# Patient Record
Sex: Male | Born: 1950 | Race: Black or African American | Hispanic: No | Marital: Married | Smoking: Former smoker
Health system: Southern US, Community
[De-identification: ages and names within clinical notes are randomized; demographics above are authoritative.]

## PROBLEM LIST (undated history)

## (undated) DIAGNOSIS — I10 Essential (primary) hypertension: Secondary | ICD-10-CM

## (undated) DIAGNOSIS — M1990 Unspecified osteoarthritis, unspecified site: Secondary | ICD-10-CM

## (undated) HISTORY — PX: NECK SURGERY: SHX720

---

## 2009-09-16 ENCOUNTER — Emergency Department (HOSPITAL_COMMUNITY): Admission: EM | Admit: 2009-09-16 | Discharge: 2009-09-16 | Payer: Self-pay | Admitting: Emergency Medicine

## 2011-02-25 ENCOUNTER — Ambulatory Visit (HOSPITAL_COMMUNITY): Payer: Self-pay | Admitting: Physical Therapy

## 2019-06-25 ENCOUNTER — Other Ambulatory Visit: Payer: Self-pay

## 2019-06-25 ENCOUNTER — Emergency Department (HOSPITAL_COMMUNITY)
Admission: EM | Admit: 2019-06-25 | Discharge: 2019-06-26 | Disposition: A | Discharge status: Home / Self Care | Payer: No Typology Code available for payment source | Attending: Emergency Medicine | Admitting: Emergency Medicine

## 2019-06-25 DIAGNOSIS — R531 Weakness: Secondary | ICD-10-CM | POA: Diagnosis not present

## 2019-06-25 DIAGNOSIS — J1282 Pneumonia due to coronavirus disease 2019: Secondary | ICD-10-CM | POA: Diagnosis not present

## 2019-06-25 DIAGNOSIS — R0602 Shortness of breath: Secondary | ICD-10-CM | POA: Diagnosis present

## 2019-06-25 DIAGNOSIS — R05 Cough: Secondary | ICD-10-CM | POA: Insufficient documentation

## 2019-06-25 DIAGNOSIS — U071 COVID-19: Secondary | ICD-10-CM | POA: Diagnosis not present

## 2019-06-25 DIAGNOSIS — R110 Nausea: Secondary | ICD-10-CM | POA: Diagnosis not present

## 2019-06-25 NOTE — ED Triage Notes (Signed)
Pt c/o sob, he was diagnosed with covid x 2 days ago.

## 2019-06-26 ENCOUNTER — Emergency Department (HOSPITAL_COMMUNITY): Payer: No Typology Code available for payment source

## 2019-06-26 ENCOUNTER — Encounter (HOSPITAL_COMMUNITY): Payer: Self-pay | Admitting: Emergency Medicine

## 2019-06-26 ENCOUNTER — Other Ambulatory Visit: Payer: Self-pay

## 2019-06-26 DIAGNOSIS — U071 COVID-19: Secondary | ICD-10-CM | POA: Diagnosis not present

## 2019-06-26 LAB — COMPREHENSIVE METABOLIC PANEL
ALT: 74 U/L — ABNORMAL HIGH (ref 0–44)
AST: 125 U/L — ABNORMAL HIGH (ref 15–41)
Albumin: 3.6 g/dL (ref 3.5–5.0)
Alkaline Phosphatase: 67 U/L (ref 38–126)
Anion gap: 11 (ref 5–15)
BUN: 36 mg/dL — ABNORMAL HIGH (ref 8–23)
CO2: 24 mmol/L (ref 22–32)
Calcium: 8.7 mg/dL — ABNORMAL LOW (ref 8.9–10.3)
Chloride: 100 mmol/L (ref 98–111)
Creatinine, Ser: 1.25 mg/dL — ABNORMAL HIGH (ref 0.61–1.24)
GFR calc Af Amer: >60 mL/min (ref >60)
GFR calc non Af Amer: 58 mL/min — ABNORMAL LOW (ref >60)
Glucose, Bld: 122 mg/dL — ABNORMAL HIGH (ref 70–99)
Potassium: 4.5 mmol/L (ref 3.5–5.1)
Sodium: 135 mmol/L (ref 135–145)
Total Bilirubin: 1.2 mg/dL (ref 0.3–1.2)
Total Protein: 7.9 g/dL (ref 6.5–8.1)

## 2019-06-26 LAB — CBC WITH DIFFERENTIAL/PLATELET
Abs Immature Granulocytes: 0.04 10*3/uL (ref 0.00–0.07)
Basophils Absolute: 0 10*3/uL (ref 0.0–0.1)
Basophils Relative: 0 %
Eosinophils Absolute: 0 10*3/uL (ref 0.0–0.5)
Eosinophils Relative: 0 %
HCT: 42.6 % (ref 39.0–52.0)
Hemoglobin: 13.5 g/dL (ref 13.0–17.0)
Immature Granulocytes: 1 %
Lymphocytes Relative: 13 %
Lymphs Abs: 1 10*3/uL (ref 0.7–4.0)
MCH: 31.7 pg (ref 26.0–34.0)
MCHC: 31.7 g/dL (ref 30.0–36.0)
MCV: 100 fL (ref 80.0–100.0)
Monocytes Absolute: 0.6 10*3/uL (ref 0.1–1.0)
Monocytes Relative: 8 %
Neutro Abs: 5.9 10*3/uL (ref 1.7–7.7)
Neutrophils Relative %: 78 %
Platelets: 155 10*3/uL (ref 150–400)
RBC: 4.26 MIL/uL (ref 4.22–5.81)
RDW: 12.2 % (ref 11.5–15.5)
WBC: 7.5 10*3/uL (ref 4.0–10.5)
nRBC: 0 % (ref 0.0–0.2)

## 2019-06-26 LAB — BRAIN NATRIURETIC PEPTIDE: B Natriuretic Peptide: 29 pg/mL (ref 0.0–100.0)

## 2019-06-26 LAB — TROPONIN I (HIGH SENSITIVITY)
Troponin I (High Sensitivity): 10 ng/L (ref <18)
Troponin I (High Sensitivity): 11 ng/L (ref <18)

## 2019-06-26 LAB — D-DIMER, QUANTITATIVE: D-Dimer, Quant: 1.24 ug/mL-FEU — ABNORMAL HIGH (ref 0.00–0.50)

## 2019-06-26 LAB — LIPASE, BLOOD: Lipase: 39 U/L (ref 11–51)

## 2019-06-26 MED ORDER — IOHEXOL 350 MG/ML SOLN
100.0000 mL | Freq: Once | INTRAVENOUS | Status: AC | PRN
Start: 2019-06-26 — End: 2019-06-26
  Administered 2019-06-26: 100 mL via INTRAVENOUS

## 2019-06-26 MED ORDER — FUROSEMIDE 10 MG/ML IJ SOLN
20.0000 mg | Freq: Once | INTRAMUSCULAR | Status: AC
Start: 2019-06-26 — End: 2019-06-26
  Administered 2019-06-26: 20 mg via INTRAVENOUS
  Filled 2019-06-26: qty 2

## 2019-06-26 MED ORDER — ALBUTEROL SULFATE HFA 108 (90 BASE) MCG/ACT IN AERS
4.0000 | INHALATION_SPRAY | Freq: Once | RESPIRATORY_TRACT | Status: AC
  Administered 2019-06-26: 4 via RESPIRATORY_TRACT
  Filled 2019-06-26: qty 6.7

## 2019-06-26 NOTE — Discharge Instructions (Signed)
Keep yourself quarantined as you have been doing.  Use Motrin or Tylenol as needed for aches and fevers.  Keep yourself hydrated.  Follow-up with your doctor.  Return to the ED with difficulty breathing, chest pain, any other concerns.

## 2019-06-26 NOTE — ED Provider Notes (Signed)
[facility] EMERGENCY DEPARTMENT Provider Note   CSN: [id number] Arrival date & time: 06/25/19  2334     History Chief Complaint  Patient presents with  . Shortness of Breath    Sergio Miller is a 69 y.o. male.  Patient reports a 1 week history of feeling poorly with body aches, nausea, chills, loss of smell, loss of taste, cough and shortness of breath.  He was seen at the [place] and found to be Covid positive on March 1.  He comes in tonight with generalized weakness, feeling poorly, nausea and shortness of breath with cough.  He states the cough is productive of clear mucus.  He has had nausea and no appetite but no vomiting.  Does not think that he has had a fever.  There is no chest pain.  There is no abdominal pain.  There is no focal weakness, numbness or tingling.  Patient denies any heart or lung problems.  Does not smoke.  No diagnosis of COPD or asthma.  No leg pain or leg swelling.  Has been using vitamin C and zinc at home without relief.  The history is provided by the patient.  Shortness of Breath Associated symptoms: cough   Associated symptoms: no abdominal pain, no chest pain, no fever, no headaches, no rash and no vomiting        History reviewed. No pertinent past medical history.  There are no problems to display for this patient.   Past Surgical History:  Procedure Laterality Date  . NECK SURGERY         No family history on file.  Social History   Tobacco Use  . Smoking status: Former Games developer  . Smokeless tobacco: Never Used  Substance Use Topics  . Alcohol use: Not Currently  . Drug use: Not Currently    Home Medications Prior to Admission medications   Not on File    Allergies    Patient has no allergy information on record.  Review of Systems   Review of Systems  Constitutional: Positive for activity change, appetite change, chills and fatigue. Negative for fever.  HENT: Positive for congestion and rhinorrhea.   Eyes: Negative for  visual disturbance.  Respiratory: Positive for cough and shortness of breath. Negative for chest tightness.   Cardiovascular: Negative for chest pain.  Gastrointestinal: Positive for nausea. Negative for abdominal pain and vomiting.  Genitourinary: Negative for dysuria and hematuria.  Musculoskeletal: Positive for arthralgias and myalgias.  Skin: Negative for rash.  Neurological: Positive for weakness. Negative for dizziness, light-headedness and headaches.   all other systems are negative except as noted in the HPI and PMH.    Physical Exam Updated Vital Signs BP 139/80   Pulse 74   Temp 99.7 F (37.6 C) (Oral)   Resp 13   Ht 5\' 11"  (1.803 m)   Wt 81.6 kg   SpO2 100%   BMI 25.10 kg/m   Physical Exam Vitals and nursing note reviewed.  Constitutional:      General: He is not in acute distress.    Appearance: Normal appearance. He is well-developed and normal weight. He is not ill-appearing.     Comments: No distress, speaking in full sentences  HENT:     Head: Normocephalic and atraumatic.     Mouth/Throat:     Pharynx: No oropharyngeal exudate.  Eyes:     Conjunctiva/sclera: Conjunctivae normal.     Pupils: Pupils are equal, round, and reactive to light.  Neck:  Comments: No meningismus. Cardiovascular:     Rate and Rhythm: Normal rate and regular rhythm.     Heart sounds: Normal heart sounds. No murmur.  Pulmonary:     Effort: Pulmonary effort is normal. No respiratory distress.     Breath sounds: Normal breath sounds. No wheezing.     Comments: Diminished breath sounds with good air exchange Chest:     Chest wall: No tenderness.  Abdominal:     Palpations: Abdomen is soft.     Tenderness: There is no abdominal tenderness. There is no guarding or rebound.  Musculoskeletal:        General: No tenderness. Normal range of motion.     Cervical back: Normal range of motion and neck supple.  Skin:    General: Skin is warm.     Capillary Refill: Capillary refill  takes less than 2 seconds.  Neurological:     General: No focal deficit present.     Mental Status: He is alert and oriented to person, place, and time. Mental status is at baseline.     Cranial Nerves: No cranial nerve deficit.     Motor: No abnormal muscle tone.     Coordination: Coordination normal.     Comments: No ataxia on finger to nose bilaterally. No pronator drift. 5/5 strength throughout. CN 2-12 intact.Equal grip strength. Sensation intact.   Psychiatric:        Behavior: Behavior normal.     ED Results / Procedures / Treatments   Labs (all labs ordered are listed, but only abnormal results are displayed) Labs Reviewed  COMPREHENSIVE METABOLIC PANEL - Abnormal; Notable for the following components:      Result Value   Glucose, Bld 122 (*)    BUN 36 (*)    Creatinine, Ser 1.25 (*)    Calcium 8.7 (*)    AST 125 (*)    ALT 74 (*)    GFR calc non Af Amer 58 (*)    All other components within normal limits  D-DIMER, QUANTITATIVE (NOT AT [facility]) - Abnormal; Notable for the following components:   D-Dimer, Quant 1.24 (*)    All other components within normal limits  CBC WITH DIFFERENTIAL/PLATELET  BRAIN NATRIURETIC PEPTIDE  LIPASE, BLOOD  URINALYSIS, ROUTINE W REFLEX MICROSCOPIC  TROPONIN I (HIGH SENSITIVITY)  TROPONIN I (HIGH SENSITIVITY)    EKG EKG Interpretation  Date/Time:  Wednesday June 26 2019 00:03:24 EST Ventricular Rate:  72 PR Interval:    QRS Duration: 97 QT Interval:  378 QTC Calculation: 414 R Axis:   41 Text Interpretation: Sinus rhythm RSR' in V1 or V2, probably normal variant Minimal ST elevation, anterior leads No previous ECGs available Confirmed by Ezequiel Essex [phone]) on 06/26/2019 12:12:32 AM   Radiology DG Chest Portable 1 View  Result Date: 06/26/2019 CLINICAL DATA:  Shortness of breath and COVID EXAM: PORTABLE CHEST 1 VIEW COMPARISON:  None. FINDINGS: The heart size and mediastinal contours are upper limits of normal. There is  prominence of the central pulmonary vasculature. No acute osseous abnormality. IMPRESSION: Pulmonary vascular congestion. Electronically Signed   By: Prudencio Pair M.D.   On: 06/26/2019 01:32    Procedures Procedures (including critical care time)  Medications Ordered in ED Medications  albuterol (VENTOLIN HFA) 108 (90 Base) MCG/ACT inhaler 4 puff (has no administration in time range)    ED Course  I have reviewed the triage vital signs and the nursing notes.  Pertinent labs & imaging results that were available  during my care of the patient were reviewed by me and considered in my medical decision making (see chart for details).    MDM Rules/Calculators/A&P                      Patient known to be Covid positive here with worsening shortness of breath, nausea, fatigue, anorexia.  Denies chest pain.  Stable vitals with no hypoxia or increased work of breathing.  Chest x-ray shows mild vascular congestion.  Patient with no respiratory distress or increased work of breathing. Labs reassuring.  Negative troponin, BNP is normal.  Mild transaminitis.  D-dimer 1.24 which is not unexpected given his Covid positive status. Minimal LFT elevation nonspecific in setting of viral illness.  He has no abdominal pain or vomiting. Low suspicion for ACS or pulmon ary embolism. Able to ambulate without desaturation.  CT scan shows no evidence of pulmonary embolism.  Does show multifocal pneumonia consistent with known diagnosis of coronavirus.  Dose of IV Lasix given due to mild edema.  Patient able to ambulate without desaturation. Supportive care discussed.  Follow-up with PCP for recheck as well as recheck of liver enzymes.  Return precautions discussed.  JAMARCO ZALDIVAR was evaluated in Emergency Department on 06/26/2019 for the symptoms described in the history of present illness. He was evaluated in the context of the global COVID-19 pandemic, which necessitated consideration that the patient might  be at risk for infection with the SARS-CoV-2 virus that causes COVID-19. Institutional protocols and algorithms that pertain to the evaluation of patients at risk for COVID-19 are in a state of rapid change based on information released by regulatory bodies including the CDC and federal and state organizations. These policies and algorithms were followed during the patient's care in the ED.  Final Clinical Impression(s) / ED Diagnoses Final diagnoses:  Pneumonia due to COVID-19 virus    Rx / DC Orders ED Discharge Orders    None       Crickett Abbett, Jeannett Senior, MD 06/26/19 [phone]

## 2019-06-26 NOTE — ED Notes (Signed)
Ambulated pt in room 02 started at 96%,dropped down to 93% during walk,pt sat down after walk 02 started to come back up.

## 2019-06-29 ENCOUNTER — Other Ambulatory Visit: Payer: Self-pay

## 2019-06-29 ENCOUNTER — Encounter (HOSPITAL_COMMUNITY): Payer: Self-pay | Admitting: Emergency Medicine

## 2019-06-29 ENCOUNTER — Emergency Department (HOSPITAL_COMMUNITY): Payer: No Typology Code available for payment source

## 2019-06-29 ENCOUNTER — Inpatient Hospital Stay (HOSPITAL_COMMUNITY)
Admission: EM | Admit: 2019-06-29 | Discharge: 2019-07-03 | DRG: 177 | Disposition: A | Discharge status: Home / Self Care | Payer: No Typology Code available for payment source | Attending: Internal Medicine | Admitting: Internal Medicine

## 2019-06-29 DIAGNOSIS — Z23 Encounter for immunization: Secondary | ICD-10-CM

## 2019-06-29 DIAGNOSIS — I10 Essential (primary) hypertension: Secondary | ICD-10-CM | POA: Diagnosis present

## 2019-06-29 DIAGNOSIS — E559 Vitamin D deficiency, unspecified: Secondary | ICD-10-CM | POA: Diagnosis present

## 2019-06-29 DIAGNOSIS — J1282 Pneumonia due to coronavirus disease 2019: Secondary | ICD-10-CM | POA: Diagnosis present

## 2019-06-29 DIAGNOSIS — R748 Abnormal levels of other serum enzymes: Secondary | ICD-10-CM | POA: Diagnosis present

## 2019-06-29 DIAGNOSIS — R0902 Hypoxemia: Secondary | ICD-10-CM | POA: Diagnosis present

## 2019-06-29 DIAGNOSIS — U071 COVID-19: Principal | ICD-10-CM

## 2019-06-29 DIAGNOSIS — R609 Edema, unspecified: Secondary | ICD-10-CM | POA: Diagnosis present

## 2019-06-29 DIAGNOSIS — R7401 Elevation of levels of liver transaminase levels: Secondary | ICD-10-CM | POA: Diagnosis not present

## 2019-06-29 DIAGNOSIS — Z87891 Personal history of nicotine dependence: Secondary | ICD-10-CM | POA: Diagnosis not present

## 2019-06-29 DIAGNOSIS — R7402 Elevation of levels of lactic acid dehydrogenase (LDH): Secondary | ICD-10-CM | POA: Diagnosis present

## 2019-06-29 DIAGNOSIS — Z8249 Family history of ischemic heart disease and other diseases of the circulatory system: Secondary | ICD-10-CM

## 2019-06-29 LAB — CBC WITH DIFFERENTIAL/PLATELET
Abs Immature Granulocytes: 0.08 10*3/uL — ABNORMAL HIGH (ref 0.00–0.07)
Basophils Absolute: 0 10*3/uL (ref 0.0–0.1)
Basophils Relative: 0 %
Eosinophils Absolute: 0 10*3/uL (ref 0.0–0.5)
Eosinophils Relative: 0 %
HCT: 42.2 % (ref 39.0–52.0)
Hemoglobin: 13.2 g/dL (ref 13.0–17.0)
Immature Granulocytes: 1 %
Lymphocytes Relative: 13 %
Lymphs Abs: 1 10*3/uL (ref 0.7–4.0)
MCH: 31.5 pg (ref 26.0–34.0)
MCHC: 31.3 g/dL (ref 30.0–36.0)
MCV: 100.7 fL — ABNORMAL HIGH (ref 80.0–100.0)
Monocytes Absolute: 0.7 10*3/uL (ref 0.1–1.0)
Monocytes Relative: 9 %
Neutro Abs: 6 10*3/uL (ref 1.7–7.7)
Neutrophils Relative %: 77 %
Platelets: 257 10*3/uL (ref 150–400)
RBC: 4.19 MIL/uL — ABNORMAL LOW (ref 4.22–5.81)
RDW: 12.1 % (ref 11.5–15.5)
WBC: 7.8 10*3/uL (ref 4.0–10.5)
nRBC: 0 % (ref 0.0–0.2)

## 2019-06-29 LAB — LACTIC ACID, PLASMA
Lactic Acid, Venous: 2 mmol/L (ref 0.5–1.9)
Lactic Acid, Venous: 1.8 mmol/L (ref 0.5–1.9)

## 2019-06-29 LAB — COMPREHENSIVE METABOLIC PANEL
ALT: 99 U/L — ABNORMAL HIGH (ref 0–44)
AST: 77 U/L — ABNORMAL HIGH (ref 15–41)
Albumin: 3.2 g/dL — ABNORMAL LOW (ref 3.5–5.0)
Alkaline Phosphatase: 98 U/L (ref 38–126)
Anion gap: 11 (ref 5–15)
BUN: 32 mg/dL — ABNORMAL HIGH (ref 8–23)
CO2: 22 mmol/L (ref 22–32)
Calcium: 8.7 mg/dL — ABNORMAL LOW (ref 8.9–10.3)
Chloride: 105 mmol/L (ref 98–111)
Creatinine, Ser: 1.09 mg/dL (ref 0.61–1.24)
GFR calc Af Amer: >60 mL/min (ref >60)
GFR calc non Af Amer: >60 mL/min (ref >60)
Glucose, Bld: 151 mg/dL — ABNORMAL HIGH (ref 70–99)
Potassium: 3.9 mmol/L (ref 3.5–5.1)
Sodium: 138 mmol/L (ref 135–145)
Total Bilirubin: 1.4 mg/dL — ABNORMAL HIGH (ref 0.3–1.2)
Total Protein: 7.7 g/dL (ref 6.5–8.1)

## 2019-06-29 LAB — FERRITIN: Ferritin: 2950 ng/mL — ABNORMAL HIGH (ref 24–336)

## 2019-06-29 LAB — LACTATE DEHYDROGENASE: LDH: 404 U/L — ABNORMAL HIGH (ref 98–192)

## 2019-06-29 LAB — PROCALCITONIN: Procalcitonin: 0.12 ng/mL

## 2019-06-29 LAB — ABO/RH: ABO/RH(D): O POS

## 2019-06-29 LAB — HIV ANTIBODY (ROUTINE TESTING W REFLEX): HIV Screen 4th Generation wRfx: NONREACTIVE

## 2019-06-29 LAB — D-DIMER, QUANTITATIVE: D-Dimer, Quant: 4.59 ug/mL-FEU — ABNORMAL HIGH (ref 0.00–0.50)

## 2019-06-29 LAB — TRIGLYCERIDES: Triglycerides: 110 mg/dL (ref <150)

## 2019-06-29 LAB — C-REACTIVE PROTEIN: CRP: 12.4 mg/dL — ABNORMAL HIGH (ref <1.0)

## 2019-06-29 LAB — FIBRINOGEN: Fibrinogen: 637 mg/dL — ABNORMAL HIGH (ref 210–475)

## 2019-06-29 MED ORDER — DEXAMETHASONE SODIUM PHOSPHATE 10 MG/ML IJ SOLN
10.0000 mg | Freq: Once | INTRAMUSCULAR | Status: AC
  Administered 2019-06-29: 10 mg via INTRAVENOUS
  Filled 2019-06-29: qty 1

## 2019-06-29 MED ORDER — ASCORBIC ACID 500 MG PO TABS
500.0000 mg | ORAL_TABLET | Freq: Every day | ORAL | Status: DC
  Administered 2019-06-29 – 2019-07-03 (×5): 500 mg via ORAL
  Filled 2019-06-29 (×5): qty 1

## 2019-06-29 MED ORDER — SODIUM CHLORIDE 0.9 % IV SOLN: 100.0000 mg | Freq: Every day | INTRAVENOUS | Status: DC

## 2019-06-29 MED ORDER — ONDANSETRON HCL 4 MG/2ML IJ SOLN: 4.0000 mg | Freq: Four times a day (QID) | INTRAMUSCULAR | Status: DC | PRN

## 2019-06-29 MED ORDER — ONDANSETRON HCL 4 MG PO TABS: 4.0000 mg | ORAL_TABLET | Freq: Four times a day (QID) | ORAL | Status: DC | PRN

## 2019-06-29 MED ORDER — TRAZODONE HCL 50 MG PO TABS
50.0000 mg | ORAL_TABLET | Freq: Every day | ORAL | Status: DC
  Administered 2019-06-29 – 2019-06-30 (×2): 50 mg via ORAL
  Filled 2019-06-29 (×3): qty 1

## 2019-06-29 MED ORDER — ALBUTEROL SULFATE (2.5 MG/3ML) 0.083% IN NEBU: 3.0000 mL | INHALATION_SOLUTION | RESPIRATORY_TRACT | Status: DC | PRN

## 2019-06-29 MED ORDER — DEXAMETHASONE SODIUM PHOSPHATE 10 MG/ML IJ SOLN
6.0000 mg | INTRAMUSCULAR | Status: DC
  Administered 2019-06-30 – 2019-07-02 (×3): 6 mg via INTRAVENOUS
  Filled 2019-06-29 (×3): qty 1

## 2019-06-29 MED ORDER — SODIUM CHLORIDE 0.9 % IV BOLUS
500.0000 mL | Freq: Once | INTRAVENOUS | Status: AC
  Administered 2019-06-29: 500 mL via INTRAVENOUS

## 2019-06-29 MED ORDER — GUAIFENESIN-DM 100-10 MG/5ML PO SYRP: 10.0000 mL | ORAL_SOLUTION | ORAL | Status: DC | PRN

## 2019-06-29 MED ORDER — ZINC SULFATE 220 (50 ZN) MG PO CAPS
220.0000 mg | ORAL_CAPSULE | Freq: Every day | ORAL | Status: DC
  Administered 2019-06-29 – 2019-07-03 (×5): 220 mg via ORAL
  Filled 2019-06-29 (×5): qty 1

## 2019-06-29 MED ORDER — ENSURE ENLIVE PO LIQD
237.0000 mL | Freq: Two times a day (BID) | ORAL | Status: DC
  Administered 2019-06-30 – 2019-07-03 (×7): 237 mL via ORAL

## 2019-06-29 MED ORDER — ENOXAPARIN SODIUM 40 MG/0.4ML ~~LOC~~ SOLN
40.0000 mg | SUBCUTANEOUS | Status: DC
  Administered 2019-06-29 – 2019-07-02 (×4): 40 mg via SUBCUTANEOUS
  Filled 2019-06-29 (×4): qty 0.4

## 2019-06-29 MED ORDER — SODIUM CHLORIDE 0.9 % IV SOLN: 100.0000 mg | INTRAVENOUS | Status: AC

## 2019-06-29 NOTE — H&P (Signed)
History and Physical    Sergio Miller:[id number] DOB: 1950-09-23 DOA: 06/29/2019  PCP: Patient, No Pcp Per   Patient coming from: Home  I have personally briefly reviewed patient's old medical records in [place]  Chief Complaint: Cough, worsening difficulty breathing.  HPI: Sergio Miller is a 69 y.o. male with no significant past medical history, recently tested positive for Covid 19 infection at [facility] 5 days ago March 1st. Presented to the ED 3/3 with complaints of body aches, productive cough and difficulty breathing.  Chest x-ray showed mild vascular congestion, CTA chest shows multifocal pneumonia but no evidence of PE.  IV Lasix given for mild edema.  Patient was able to ambulate without desaturation hours and was discharged to follow-up closely with primary care provider.  Patient presented today with complaints of difficulty breathing, not able to eat, nausea and 2 episodes of diarrhea, not being able to sleep since he left the hospital.  He denies chest pain.  ED Course: O2 sats greater than 93% on room air, with ambulation O2 sat momentarily dropped to 89%, but mostly remained above 92%.  Temperature 98.4.  Mild elevation in liver enzymes AST 77, ALT 99.  Increasing D-dimer compared to 3 days ago.  Procalcitonin 0.12.  Also elevated LDH, fibrinogen.  In-house Covid 19 test pending.  Chest x-ray today consistent with Covid pneumonia.  Hospitalist to admit for further evaluation and management.  Review of Systems: As per HPI all other systems reviewed and negative.  History reviewed. No pertinent past medical history.  Past Surgical History:  Procedure Laterality Date  . NECK SURGERY       reports that he has quit smoking. He has never used smokeless tobacco. He reports previous alcohol use. He reports previous drug use.  No Known Allergies  Family hx of Hypertension.  Prior to Admission medications   Not on File    Physical Exam: Vitals:   06/29/19  1400 06/29/19 1415 06/29/19 1430 06/29/19 1500  BP:   (!) 146/85 (!) 141/84  Pulse: 65 (!) 58    Resp: 16 (!) 25 (!) 21 20  Temp:      TempSrc:      SpO2: 96% 95%    Weight:      Height:        Constitutional: NAD, calm, comfortable Vitals:   06/29/19 1400 06/29/19 1415 06/29/19 1430 06/29/19 1500  BP:   (!) 146/85 (!) 141/84  Pulse: 65 (!) 58    Resp: 16 (!) 25 (!) 21 20  Temp:      TempSrc:      SpO2: 96% 95%    Weight:      Height:       Eyes: PERRL, lids and conjunctivae normal ENMT: Mucous membranes are moist. Neck: normal, supple, no masses, no thyromegaly Respiratory:  Normal respiratory effort. No accessory muscle use.  Cardiovascular: Regular rate and rhythm, no murmurs / rubs / gallops. No extremity edema. 2+ pedal pulses. Abdomen: no tenderness, no masses palpated. No hepatosplenomegaly. Bowel sounds positive.  Musculoskeletal: no clubbing / cyanosis. No joint deformity upper and lower extremities. Good ROM, no contractures. Normal muscle tone.  Skin: no rashes, lesions, ulcers. No induration Neurologic: No apparent cranial nerve abnormality, moving all extremities spontaneously. Psychiatric: Normal judgment and insight. Alert and oriented x 3. Normal mood.   Labs on Admission: I have personally reviewed following labs and imaging studies  CBC: Recent Labs  Lab 06/26/19 0052 06/29/19 1319  WBC 7.5 7.8  NEUTROABS 5.9 6.0  HGB 13.5 13.2  HCT 42.6 42.2  MCV 100.0 100.7*  PLT 155 257   Basic Metabolic Panel: Recent Labs  Lab 06/26/19 0052 06/29/19 1319  NA 135 138  K 4.5 3.9  CL 100 105  CO2 24 22  GLUCOSE 122* 151*  BUN 36* 32*  CREATININE 1.25* 1.09  CALCIUM 8.7* 8.7*   Liver Function Tests: Recent Labs  Lab 06/26/19 0052 06/29/19 1319  AST 125* 77*  ALT 74* 99*  ALKPHOS 67 98  BILITOT 1.2 1.4*  PROT 7.9 7.7  ALBUMIN 3.6 3.2*   Recent Labs  Lab 06/26/19 0253  LIPASE 39   Lipid Profile: Recent Labs    06/29/19 1319  TRIG 110    Anemia Panel: Recent Labs    06/29/19 1340  FERRITIN 2,950*    Radiological Exams on Admission: DG Chest Port 1 View  Result Date: 06/29/2019 CLINICAL DATA:  Increased shortness of breath, decreased appetite, unable to sleep, nausea and diarrhea since discharge. EXAM: PORTABLE CHEST 1 VIEW COMPARISON:  Chest radiograph 06/26/2019 FINDINGS: Stable cardiomediastinal contours. There are bilateral coarse interstitial opacities which appear similar to prior. No new focal pulmonary opacity. No pneumothorax or significant pleural effusion. No acute finding in the visualized skeleton. IMPRESSION: No acute cardiopulmonary finding. Appearance of the chest is similar to prior radiograph when patient was diagnosed with COVID pneumonia. Of note the pneumonia was not well appreciated radiographically. Electronically Signed   By: Emmaline Kluver M.D.   On: 06/29/2019 13:41    EKG: Sinus rhythm rate 66.  QTc 438.  No significant ST-T wave abnormalities, compared to EKG 3 days ago.  Assessment/Plan Active Problems:   Pneumonia due to COVID-19 virus  COVID-19 pneumonia-dyspnea, cough, diarrhea.  O2 sats greater than 93% at rest, momentarily dropped to 88% during ambulation.  Tested + for [facility]-19 at [facility] March 1.  Increase in D-dimer 1.24 > 4.59.  Other inflammatory markers elevated- CRP, Ferritin, fibrinogen. CTA chest 3/3, negative for PE, consistent with Covid pneumonia.  Chest x-ray today with similar findings.  Procalcitonin 0.12.  WBC 7.8.  COVID-19 test pending. -IV dexamethasone 10 mg x 1, continue 6 mg daily -Remdesivir pharmacy to dose pending positive Covid result -Daily inflammatory markers -Daily CBC, CMP -Flutter valve, incentive spirometry -Multivitamins -Mucolytic's, PRN albuterol inhaler, as needed supplemental O2 - COVID 19 admission protocol.   Transaminitis- AST 77, ALT- 99. Likely from viral covid infection.  - Acute hepatitis panel check.  DVT prophylaxis: Lovenox Code Status:  Full code Family Communication: None at bedside Disposition Plan: > 2 days, requiring steroids and intravenous antiviral treatments Consults called: None Admission status: Inpatient telemetry. I certify that at the point of admission it is my clinical judgment that the patient will require inpatient hospital care spanning beyond 2 midnights from the point of admission due to high intensity of service, high risk for further deterioration and high frequency of surveillance required.    Onnie Boer MD [facility]  06/29/2019, 6:03 PM

## 2019-06-29 NOTE — ED Notes (Signed)
Pt ambulated back and forth around bed within room, on third time pt RA sats decreased to 88% for a couple of seconds, as soon as pt returned and sat on side of bed, RA sats back to 94-95 %.  RA sats mostly stayed 96-92% during ambulation

## 2019-06-29 NOTE — ED Notes (Signed)
Date and time results received: 06/29/19 1422 (use smartphrase ".now" to insert current time)  Test: Lactic Acid   Critical Value: 2.0  Name of Provider Notified: Dr. Rubin Payor  Orders Received? Or Actions Taken?: na

## 2019-06-29 NOTE — ED Provider Notes (Signed)
[place] Provider Note   CSN: [id number] Arrival date & time: 06/29/19  1236     History Chief Complaint  Patient presents with  . Shortness of Breath    Sergio Miller is a 69 y.o. male.  HPI Patient presents with known Covid infection.  Reportedly diagnosed foot through the [place] when he was seen on the ER 4 days ago.  States he had symptoms for around a week before that.  ER thought he had some volume overloaded and given some Lasix.  CT scan done and showed likely Covid pneumonia.  Patient states that he has been getting worse.  States he really cannot get up and move around home.  States he has no appetite.  Is had nausea vomiting diarrhea.  States he gets so short of breath he really cannot walk around.  States he is worried it is going to die at home alone.  Denies previous lung issues.    History reviewed. No pertinent past medical history.  There are no problems to display for this patient.   Past Surgical History:  Procedure Laterality Date  . NECK SURGERY         No family history on file.  Social History   Tobacco Use  . Smoking status: Former Research scientist (life sciences)  . Smokeless tobacco: Never Used  Substance Use Topics  . Alcohol use: Not Currently  . Drug use: Not Currently    Home Medications Prior to Admission medications   Not on File    Allergies    Patient has no known allergies.  Review of Systems   Review of Systems  Physical Exam Updated Vital Signs BP 123/84   Pulse 65   Temp 98.4 F (36.9 C) (Oral)   Resp 16   Ht 5\' 11"  (1.803 m)   Wt 86 kg   SpO2 96%   BMI 26.44 kg/m   Physical Exam  ED Results / Procedures / Treatments   Labs (all labs ordered are listed, but only abnormal results are displayed) Labs Reviewed  LACTIC ACID, PLASMA - Abnormal; Notable for the following components:      Result Value   Lactic Acid, Venous 2.0 (*)    All other components within normal limits  CBC WITH DIFFERENTIAL/PLATELET -  Abnormal; Notable for the following components:   RBC 4.19 (*)    MCV 100.7 (*)    Abs Immature Granulocytes 0.08 (*)    All other components within normal limits  COMPREHENSIVE METABOLIC PANEL - Abnormal; Notable for the following components:   Glucose, Bld 151 (*)    BUN 32 (*)    Calcium 8.7 (*)    Albumin 3.2 (*)    AST 77 (*)    ALT 99 (*)    Total Bilirubin 1.4 (*)    All other components within normal limits  D-DIMER, QUANTITATIVE (NOT AT [facility]) - Abnormal; Notable for the following components:   D-Dimer, Quant 4.59 (*)    All other components within normal limits  LACTATE DEHYDROGENASE - Abnormal; Notable for the following components:   LDH 404 (*)    All other components within normal limits  FIBRINOGEN - Abnormal; Notable for the following components:   Fibrinogen 637 (*)    All other components within normal limits  CULTURE, BLOOD (ROUTINE X 2)  CULTURE, BLOOD (ROUTINE X 2)  SARS CORONAVIRUS 2 (TAT 6-24 HRS)  PROCALCITONIN  TRIGLYCERIDES  LACTIC ACID, PLASMA  FERRITIN  C-REACTIVE PROTEIN    EKG  EKG Interpretation  Date/Time:  Saturday June 29 2019 13:15:04 EST Ventricular Rate:  66 PR Interval:    QRS Duration: 93 QT Interval:  418 QTC Calculation: 438 R Axis:   35 Text Interpretation: Sinus rhythm RSR' in V1 or V2, probably normal variant ST elevation, consider inferior injury Confirmed by Benjiman Core [phone]) on 06/29/2019 1:40:48 PM   Radiology DG Chest Port 1 View  Result Date: 06/29/2019 CLINICAL DATA:  Increased shortness of breath, decreased appetite, unable to sleep, nausea and diarrhea since discharge. EXAM: PORTABLE CHEST 1 VIEW COMPARISON:  Chest radiograph 06/26/2019 FINDINGS: Stable cardiomediastinal contours. There are bilateral coarse interstitial opacities which appear similar to prior. No new focal pulmonary opacity. No pneumothorax or significant pleural effusion. No acute finding in the visualized skeleton. IMPRESSION: No acute  cardiopulmonary finding. Appearance of the chest is similar to prior radiograph when patient was diagnosed with COVID pneumonia. Of note the pneumonia was not well appreciated radiographically. Electronically Signed   By: Emmaline Kluver M.D.   On: 06/29/2019 13:41    Procedures Procedures (including critical care time)  Medications Ordered in ED Medications  sodium chloride 0.9 % bolus 500 mL (has no administration in time range)    ED Course  I have reviewed the triage vital signs and the nursing notes.  Pertinent labs & imaging results that were available during my care of the patient were reviewed by me and considered in my medical decision making (see chart for details).    MDM Rules/Calculators/A&P                     Patient with Covid pneumonia.  CT scan done recently that showed pneumonia and had outpatient Covid test was positive.  Repeated test here.  Has not done well at home since being discharged.  States more short of breath nausea vomiting and really not able to eat and drink.  Patient is somewhat dyspneic and having difficulty speaking.  Oxygenation is okay at rest with sats it will dip from the low 90s up to the mid 90s.  However with ambulation his sats went down to the upper 80s.  Feels the patient would benefit from [facility] the hospital.  Will discuss with hospitalist Final Clinical Impression(s) / ED Diagnoses Final diagnoses:  Pneumonia due to COVID-19 virus  Hypoxia    Rx / DC Orders ED Discharge Orders    None       Benjiman Core, MD 06/29/19 1501

## 2019-06-29 NOTE — ED Triage Notes (Addendum)
Increased shortness of breath, decreased appetite, unable to sleep, nausea and diarrhea since discharge on wed, pt covid +

## 2019-06-30 ENCOUNTER — Encounter (HOSPITAL_COMMUNITY): Payer: Self-pay | Admitting: Internal Medicine

## 2019-06-30 DIAGNOSIS — R748 Abnormal levels of other serum enzymes: Secondary | ICD-10-CM | POA: Diagnosis present

## 2019-06-30 DIAGNOSIS — R7401 Elevation of levels of liver transaminase levels: Secondary | ICD-10-CM

## 2019-06-30 DIAGNOSIS — J1282 Pneumonia due to coronavirus disease 2019: Secondary | ICD-10-CM

## 2019-06-30 DIAGNOSIS — I10 Essential (primary) hypertension: Secondary | ICD-10-CM

## 2019-06-30 DIAGNOSIS — U071 COVID-19: Principal | ICD-10-CM

## 2019-06-30 LAB — CBC WITH DIFFERENTIAL/PLATELET
Abs Immature Granulocytes: 0.12 10*3/uL — ABNORMAL HIGH (ref 0.00–0.07)
Basophils Absolute: 0 10*3/uL (ref 0.0–0.1)
Basophils Relative: 0 %
Eosinophils Absolute: 0 10*3/uL (ref 0.0–0.5)
Eosinophils Relative: 0 %
HCT: 38.9 % — ABNORMAL LOW (ref 39.0–52.0)
Hemoglobin: 12.3 g/dL — ABNORMAL LOW (ref 13.0–17.0)
Immature Granulocytes: 2 %
Lymphocytes Relative: 13 %
Lymphs Abs: 0.9 10*3/uL (ref 0.7–4.0)
MCH: 31.4 pg (ref 26.0–34.0)
MCHC: 31.6 g/dL (ref 30.0–36.0)
MCV: 99.2 fL (ref 80.0–100.0)
Monocytes Absolute: 0.6 10*3/uL (ref 0.1–1.0)
Monocytes Relative: 9 %
Neutro Abs: 4.9 10*3/uL (ref 1.7–7.7)
Neutrophils Relative %: 76 %
Platelets: 271 10*3/uL (ref 150–400)
RBC: 3.92 MIL/uL — ABNORMAL LOW (ref 4.22–5.81)
RDW: 12.3 % (ref 11.5–15.5)
WBC: 6.5 10*3/uL (ref 4.0–10.5)
nRBC: 0 % (ref 0.0–0.2)

## 2019-06-30 LAB — HEMOGLOBIN A1C
Hgb A1c MFr Bld: 6.2 % — ABNORMAL HIGH (ref 4.8–5.6)
Mean Plasma Glucose: 131.24 mg/dL

## 2019-06-30 LAB — COMPREHENSIVE METABOLIC PANEL
ALT: 147 U/L — ABNORMAL HIGH (ref 0–44)
AST: 115 U/L — ABNORMAL HIGH (ref 15–41)
Albumin: 2.9 g/dL — ABNORMAL LOW (ref 3.5–5.0)
Alkaline Phosphatase: 111 U/L (ref 38–126)
Anion gap: 9 (ref 5–15)
BUN: 29 mg/dL — ABNORMAL HIGH (ref 8–23)
CO2: 23 mmol/L (ref 22–32)
Calcium: 8.9 mg/dL (ref 8.9–10.3)
Chloride: 109 mmol/L (ref 98–111)
Creatinine, Ser: 0.95 mg/dL (ref 0.61–1.24)
GFR calc Af Amer: >60 mL/min (ref >60)
GFR calc non Af Amer: >60 mL/min (ref >60)
Glucose, Bld: 153 mg/dL — ABNORMAL HIGH (ref 70–99)
Potassium: 4.2 mmol/L (ref 3.5–5.1)
Sodium: 141 mmol/L (ref 135–145)
Total Bilirubin: 1.1 mg/dL (ref 0.3–1.2)
Total Protein: 6.9 g/dL (ref 6.5–8.1)

## 2019-06-30 LAB — D-DIMER, QUANTITATIVE: D-Dimer, Quant: 3.74 ug/mL-FEU — ABNORMAL HIGH (ref 0.00–0.50)

## 2019-06-30 LAB — PHOSPHORUS: Phosphorus: 3.4 mg/dL (ref 2.5–4.6)

## 2019-06-30 LAB — HEPATITIS PANEL, ACUTE
HCV Ab: NONREACTIVE
Hep A IgM: NONREACTIVE
Hep B C IgM: NONREACTIVE
Hepatitis B Surface Ag: NONREACTIVE

## 2019-06-30 LAB — VITAMIN D 25 HYDROXY (VIT D DEFICIENCY, FRACTURES): Vit D, 25-Hydroxy: 18.13 ng/mL — ABNORMAL LOW (ref 30–100)

## 2019-06-30 LAB — MAGNESIUM: Magnesium: 2.5 mg/dL — ABNORMAL HIGH (ref 1.7–2.4)

## 2019-06-30 LAB — FERRITIN: Ferritin: 2591 ng/mL — ABNORMAL HIGH (ref 24–336)

## 2019-06-30 LAB — SARS CORONAVIRUS 2 (TAT 6-24 HRS): SARS Coronavirus 2: POSITIVE — AB

## 2019-06-30 LAB — C-REACTIVE PROTEIN: CRP: 10.1 mg/dL — ABNORMAL HIGH (ref <1.0)

## 2019-06-30 MED ORDER — SODIUM CHLORIDE 0.9 % IV SOLN
100.0000 mg | Freq: Every day | INTRAVENOUS | Status: DC
  Administered 2019-07-01 – 2019-07-03 (×3): 100 mg via INTRAVENOUS
  Filled 2019-06-30 (×3): qty 20

## 2019-06-30 MED ORDER — AMLODIPINE BESYLATE 5 MG PO TABS
5.0000 mg | ORAL_TABLET | Freq: Every day | ORAL | Status: DC
  Administered 2019-06-30 – 2019-07-03 (×4): 5 mg via ORAL
  Filled 2019-06-30 (×4): qty 1

## 2019-06-30 MED ORDER — SODIUM CHLORIDE 0.9 % IV SOLN
100.0000 mg | INTRAVENOUS | Status: AC
  Administered 2019-06-30 (×2): 100 mg via INTRAVENOUS
  Filled 2019-06-30 (×2): qty 20

## 2019-06-30 NOTE — Progress Notes (Signed)
LAB called with critical--COVID positive

## 2019-06-30 NOTE — Progress Notes (Addendum)
PROGRESS NOTE [facility]   Sergio Miller  KYH:[id number]  DOB: 1950/06/04  DOA: 06/29/2019 PCP: Patient, No Pcp Per   Brief Admission Hx: 69 year old gentleman recently tested positive for COVID-19 infection in [place] 5 days prior to arrival presented with progressive shortness of breath cough and body aches.  He was admitted with Covid pneumonia.  MDM/Assessment & Plan:   1. Covid pneumonia-patient presented with symptomatic shortness of breath, positive Covid test and abnormal chest x-ray and elevated inflammatory markers.  The patient has been started on dexamethasone and remdesivir per pharmacy.  We are following inflammatory markers daily providing supportive care and Covid isolation.  Check Vitamin D level.  2. Elevated liver enzymes-hepatitis panel pending continue to follow closely possibly related to acute Covid 19 illness.  If LFTs continue rising would discontinue remdesivir. 3. Essential hypertension-patient has had elevated blood pressure readings not currently on home medications or treatments for this will follow and if persist initiate treatments.   DVT prophylaxis: lovenox Code Status: Full Family Communication: patient updated bedside, verbalizes understanding, has full capacity Disposition Plan: Patient from home requiring IV remdesivir treatments and steroids and supportive care for COVID-19 infection when he is medically stabilized should be able to discharge home  Consultants:    Procedures:    Antimicrobials:     Subjective: Pt reports that he still has SOB especially when ambulating to bathroom.   Objective: Vitals:   06/29/19 1702 06/29/19 2120 06/30/19 0459 06/30/19 0811  BP: (!) 143/84 122/78 (!) 138/96 (!) 139/95  Pulse: (!) 53 61 63 (!) 56  Resp: 18 17 18 18   Temp: 98.7 F (37.1 C) 97.7 F (36.5 C) 98.3 F (36.8 C) 97.6 F (36.4 C)  TempSrc: Oral Oral Oral Oral  SpO2: 95% 99% 98% 95%  Weight:      Height:         Intake/Output Summary (Last 24 hours) at 06/30/2019 1144 Last data filed at 06/30/2019 0600 Gross per 24 hour  Intake 657.31 ml  Output --  Net 657.31 ml   Filed Weights   06/29/19 1249  Weight: 86 kg     REVIEW OF SYSTEMS  As per history otherwise all reviewed and reported negative  Exam:  General exam: elderly gentleman awake, alert, NAD, cooperative.  Respiratory system:  No increased work of breathing. Cardiovascular system: S1 & S2 heard. No JVD, murmurs, gallops, clicks or pedal edema. Gastrointestinal system: Abdomen is nondistended, soft and nontender. Normal bowel sounds heard. Central nervous system: Alert and oriented. No focal neurological deficits. Extremities: no CCE.  Data Reviewed: Basic Metabolic Panel: Recent Labs  Lab 06/26/19 0052 06/29/19 1319 06/30/19 0654  NA 135 138 141  K 4.5 3.9 4.2  CL 100 105 109  CO2 24 22 23   GLUCOSE 122* 151* 153*  BUN 36* 32* 29*  CREATININE 1.25* 1.09 0.95  CALCIUM 8.7* 8.7* 8.9  MG  --   --  2.5*  PHOS  --   --  3.4   Liver Function Tests: Recent Labs  Lab 06/26/19 0052 06/29/19 1319 06/30/19 0654  AST 125* 77* 115*  ALT 74* 99* 147*  ALKPHOS 67 98 111  BILITOT 1.2 1.4* 1.1  PROT 7.9 7.7 6.9  ALBUMIN 3.6 3.2* 2.9*   Recent Labs  Lab 06/26/19 0253  LIPASE 39   No results for input(s): AMMONIA in the last 168 hours. CBC: Recent Labs  Lab 06/26/19 0052 06/29/19 1319 06/30/19 0654  WBC 7.5 7.8 6.5  NEUTROABS 5.9 6.0 4.9  HGB 13.5 13.2 12.3*  HCT 42.6 42.2 38.9*  MCV 100.0 100.7* 99.2  PLT 155 257 271   Cardiac Enzymes: No results for input(s): CKTOTAL, CKMB, CKMBINDEX, TROPONINI in the last 168 hours. CBG (last 3)  No results for input(s): GLUCAP in the last 72 hours. Recent Results (from the past 240 hour(s))  SARS CORONAVIRUS 2 (TAT 6-24 HRS) Nasopharyngeal Nasopharyngeal Swab     Status: Abnormal   Collection Time: 06/29/19  1:00 PM   Specimen: Nasopharyngeal Swab  Result Value Ref  Range Status   SARS Coronavirus 2 POSITIVE (A) NEGATIVE Final    Comment: RESULT CALLED TO, READ BACK BY AND VERIFIED WITH: RN RENEE BONDURANT AT 6433 BY MESSAN HOUEGNIFIO ON 06/30/2019 (NOTE) SARS-CoV-2 target nucleic acids are DETECTED. The SARS-CoV-2 RNA is generally detectable in upper and lower respiratory specimens during the acute phase of infection. Positive results are indicative of the presence of SARS-CoV-2 RNA. Clinical correlation with patient history and other diagnostic information is  necessary to determine patient infection status. Positive results do not rule out bacterial infection or co-infection with other viruses.  The expected result is Negative. Fact Sheet for Patients: SugarRoll.be Fact Sheet for Healthcare Providers: https://www.woods-mathews.com/ This test is not yet approved or cleared by the [place] FDA and  has been authorized for detection and/or diagnosis of SARS-CoV-2 by FDA under an Emergency Use Authorization (EUA). This EUA will remain  in effect (meaning this t est can be used) for the duration of the COVID-19 declaration under Section 564(b)(1) of the Act, 21 U.S.C. section 360bbb-3(b)(1), unless the authorization is terminated or revoked sooner. Performed at [facility], [place] [address] [place], [place] [address]   Blood Culture (routine x 2)     Status: None (Preliminary result)   Collection Time: 06/29/19  1:19 PM   Specimen: Right Antecubital; Blood  Result Value Ref Range Status   Specimen Description   Final    RIGHT ANTECUBITAL BOTTLES DRAWN AEROBIC AND ANAEROBIC   Special Requests   Final    Blood Culture adequate volume Performed at [facility], [address] [place], [place] [address]    Culture PENDING  Incomplete   Report Status PENDING  Incomplete  Blood Culture (routine x 2)     Status: None (Preliminary result)   Collection Time: 06/29/19  1:40 PM   Specimen: Left  Antecubital; Blood  Result Value Ref Range Status   Specimen Description   Final    LEFT ANTECUBITAL BOTTLES DRAWN AEROBIC AND ANAEROBIC   Special Requests   Final    Blood Culture adequate volume Performed at [facility], [address] [place], [place] [address]    Culture PENDING  Incomplete   Report Status PENDING  Incomplete     Studies: DG Chest Port 1 View  Result Date: 06/29/2019 CLINICAL DATA:  Increased shortness of breath, decreased appetite, unable to sleep, nausea and diarrhea since discharge. EXAM: PORTABLE CHEST 1 VIEW COMPARISON:  Chest radiograph 06/26/2019 FINDINGS: Stable cardiomediastinal contours. There are bilateral coarse interstitial opacities which appear similar to prior. No new focal pulmonary opacity. No pneumothorax or significant pleural effusion. No acute finding in the visualized skeleton. IMPRESSION: No acute cardiopulmonary finding. Appearance of the chest is similar to prior radiograph when patient was diagnosed with COVID pneumonia. Of note the pneumonia was not well appreciated radiographically. Electronically Signed   By: Audie Pinto M.D.   On: 06/29/2019 13:41  Scheduled Meds: . vitamin C  500 mg Oral Daily  . dexamethasone (DECADRON) injection  6 mg Intravenous Q24H  . enoxaparin (LOVENOX) injection  40 mg Subcutaneous Q24H  . feeding supplement (ENSURE ENLIVE)  237 mL Oral BID BM  . traZODone  50 mg Oral QHS  . zinc sulfate  220 mg Oral Daily   Continuous Infusions: . [START ON 07/01/2019] remdesivir 100 mg in NS 100 mL      Active Problems:   Pneumonia due to COVID-19 virus   Time spent:   Standley Dakins, MD [facility] 06/30/2019, 11:44 AM    LOS: 1 day  How to contact the [facility] Attending or Consulting provider 7A - 7P or covering provider during after hours 7P -7A, for this patient?  1. Check the care team in [facility] and look for a) attending/consulting TRH provider listed and b) the [facility] team listed 2. Log into  [email or web address] and use [place]'s universal password to access. If you do not have the password, please contact the hospital operator. 3. Locate the [facility] provider you are looking for under [facility] and page to a number that you can be directly reached. 4. If you still have difficulty reaching the provider, please page the [facility] (Director on Call) for the Hospitalists listed on amion for assistance.

## 2019-06-30 NOTE — Plan of Care (Signed)

## 2019-07-01 LAB — CBC WITH DIFFERENTIAL/PLATELET
Abs Immature Granulocytes: 0.19 10*3/uL — ABNORMAL HIGH (ref 0.00–0.07)
Basophils Absolute: 0 10*3/uL (ref 0.0–0.1)
Basophils Relative: 0 %
Eosinophils Absolute: 0 10*3/uL (ref 0.0–0.5)
Eosinophils Relative: 0 %
HCT: 40.2 % (ref 39.0–52.0)
Hemoglobin: 12.6 g/dL — ABNORMAL LOW (ref 13.0–17.0)
Immature Granulocytes: 2 %
Lymphocytes Relative: 12 %
Lymphs Abs: 1.1 10*3/uL (ref 0.7–4.0)
MCH: 31.3 pg (ref 26.0–34.0)
MCHC: 31.3 g/dL (ref 30.0–36.0)
MCV: 99.8 fL (ref 80.0–100.0)
Monocytes Absolute: 1 10*3/uL (ref 0.1–1.0)
Monocytes Relative: 11 %
Neutro Abs: 7 10*3/uL (ref 1.7–7.7)
Neutrophils Relative %: 75 %
Platelets: 293 10*3/uL (ref 150–400)
RBC: 4.03 MIL/uL — ABNORMAL LOW (ref 4.22–5.81)
RDW: 11.9 % (ref 11.5–15.5)
WBC: 9.3 10*3/uL (ref 4.0–10.5)
nRBC: 0 % (ref 0.0–0.2)

## 2019-07-01 LAB — COMPREHENSIVE METABOLIC PANEL
ALT: 144 U/L — ABNORMAL HIGH (ref 0–44)
AST: 77 U/L — ABNORMAL HIGH (ref 15–41)
Albumin: 2.8 g/dL — ABNORMAL LOW (ref 3.5–5.0)
Alkaline Phosphatase: 103 U/L (ref 38–126)
Anion gap: 9 (ref 5–15)
BUN: 28 mg/dL — ABNORMAL HIGH (ref 8–23)
CO2: 26 mmol/L (ref 22–32)
Calcium: 9.2 mg/dL (ref 8.9–10.3)
Chloride: 107 mmol/L (ref 98–111)
Creatinine, Ser: 0.96 mg/dL (ref 0.61–1.24)
GFR calc Af Amer: >60 mL/min (ref >60)
GFR calc non Af Amer: >60 mL/min (ref >60)
Glucose, Bld: 127 mg/dL — ABNORMAL HIGH (ref 70–99)
Potassium: 4.4 mmol/L (ref 3.5–5.1)
Sodium: 142 mmol/L (ref 135–145)
Total Bilirubin: 0.7 mg/dL (ref 0.3–1.2)
Total Protein: 7 g/dL (ref 6.5–8.1)

## 2019-07-01 LAB — D-DIMER, QUANTITATIVE: D-Dimer, Quant: 2.82 ug/mL-FEU — ABNORMAL HIGH (ref 0.00–0.50)

## 2019-07-01 LAB — PHOSPHORUS: Phosphorus: 3.4 mg/dL (ref 2.5–4.6)

## 2019-07-01 LAB — FERRITIN: Ferritin: 1723 ng/mL — ABNORMAL HIGH (ref 24–336)

## 2019-07-01 LAB — MAGNESIUM: Magnesium: 2.2 mg/dL (ref 1.7–2.4)

## 2019-07-01 LAB — C-REACTIVE PROTEIN: CRP: 7.2 mg/dL — ABNORMAL HIGH (ref <1.0)

## 2019-07-01 MED ORDER — VITAMIN D (ERGOCALCIFEROL) 1.25 MG (50000 UNIT) PO CAPS
50000.0000 [IU] | ORAL_CAPSULE | ORAL | Status: DC
  Administered 2019-07-01: 50000 [IU] via ORAL
  Filled 2019-07-01: qty 1

## 2019-07-01 MED ORDER — PRO-STAT SUGAR FREE PO LIQD
30.0000 mL | Freq: Two times a day (BID) | ORAL | Status: DC
  Administered 2019-07-01 – 2019-07-03 (×5): 30 mL via ORAL
  Filled 2019-07-01 (×5): qty 30

## 2019-07-01 MED ORDER — PNEUMOCOCCAL VAC POLYVALENT 25 MCG/0.5ML IJ INJ
0.5000 mL | INJECTION | INTRAMUSCULAR | Status: AC
  Administered 2019-07-02: 0.5 mL via INTRAMUSCULAR
  Filled 2019-07-01: qty 0.5

## 2019-07-01 NOTE — Plan of Care (Signed)

## 2019-07-01 NOTE — Progress Notes (Signed)
Initial Nutrition Assessment  DOCUMENTATION CODES:     INTERVENTION:  Increase Ensure Enlive po BID, each supplement provides 350 kcal and 20 grams of protein   ProStat 30 ml BID (each 30 ml provides 100 kcal, 15 gr protein)   Regular diet   NUTRITION DIAGNOSIS:   Increased nutrient needs related to acute illness(COVID pneumonia) as evidenced by estimated needs.  GOAL:  Patient will meet greater than or equal to 90% of their needs   MONITOR:  PO intake, Supplement acceptance, Labs, Weight trends, Skin   REASON FOR ASSESSMENT:  Malnutrition Screening Tool    ASSESSMENT: Patient is a 69 yo who presents with COVID pneumonia. Increased shortness of breath, nausea, diarrhea and decrease in desire for food. Tested positive on March 1st.   Regular diet- no meal intake data.   Labs: reviewed. 3/6- Ferritin 2,950.  Medications: MVI, Vitamin C, Vitamin D (50K units), Zinc, decadron   IV- dexamethasone, Remdesivir     Diet Order:   Diet Order            Diet regular Room service appropriate? Yes; Fluid consistency: Thin  Diet effective now             EDUCATION NEEDS:  Not appropriate for education at this time Skin:  Skin Assessment: Reviewed RN Assessment  Last BM:  3/7  Height:   Ht Readings from Last 1 Encounters:  06/29/19 5\' 11"  (1.803 m)    Weight:   Wt Readings from Last 1 Encounters:  06/29/19 86 kg  3/3- wt 81.6 kg  Ideal Body Weight:   78 kg  BMI:  Body mass index is 26.44 kg/m.  Estimated Nutritional Needs:   Kcal:  08/29/19  Protein:  103-112 gr  Fluid:  >2 liters daily   [phone] MS,RD,CSG,LDN Pager # in [place]

## 2019-07-01 NOTE — Progress Notes (Signed)
PROGRESS NOTE [facility]   AADEN BUCKMAN  ESP:[id number]  DOB: 1951/03/30  DOA: 06/29/2019 PCP: Patient, No Pcp Per   Brief Admission Hx: 69 year old gentleman recently tested positive for COVID-19 infection in [place] 5 days prior to arrival presented with progressive shortness of breath cough and body aches.  He was admitted with Covid pneumonia.  MDM/Assessment & Plan:   1. Covid pneumonia-patient presented with symptomatic shortness of breath, positive Covid test and abnormal chest x-ray and elevated inflammatory markers.  The patient has been started on dexamethasone and remdesivir per pharmacy.  We are following inflammatory markers daily providing supportive care and Covid isolation.    2. Elevated liver enzymes-hepatitis panel negative, LFTs trending down, continue to follow closely possibly related to acute Covid 19 illness.  3. Essential hypertension-patient has had elevated blood pressure readings not currently on home medications or treatments for this will follow and if persist initiate treatments. 4. Vitamin D deficiency - oral replacement ordered.   DVT prophylaxis: lovenox Code Status: Full Family Communication: patient updated bedside, verbalizes understanding, has full capacity Disposition Plan: Patient from home requiring IV remdesivir treatments and steroids and supportive care for COVID-19 infection when he is medically stabilized should be able to discharge home  Consultants:    Procedures:    Antimicrobials:     Subjective: Pt reports SOB mostly with ambulation, no chest pain, no fever or chills.   Objective: Vitals:   06/30/19 1957 06/30/19 2132 07/01/19 0533 07/01/19 1455  BP:  125/80 121/71 129/76  Pulse:  (!) 55 61 66  Resp:  17 16 18   Temp:  97.7 F (36.5 C) (!) 97.3 F (36.3 C) 98.3 F (36.8 C)  TempSrc:  Oral Oral Oral  SpO2: 97% 93% 94% 93%  Weight:      Height:       No intake or output data in the 24 hours  ending 07/01/19 1525 Filed Weights   06/29/19 1249  Weight: 86 kg     REVIEW OF SYSTEMS  As per history otherwise all reviewed and reported negative  Exam:  General exam: elderly gentleman awake, alert, NAD, cooperative.  Respiratory system:  No increased work of breathing. Cardiovascular system: S1 & S2 heard. No JVD, murmurs, gallops, clicks or pedal edema. Gastrointestinal system: Abdomen is nondistended, soft and nontender. Normal bowel sounds heard. Central nervous system: Alert and oriented. No focal neurological deficits. Extremities: no CCE.  Data Reviewed: Basic Metabolic Panel: Recent Labs  Lab 06/26/19 0052 06/29/19 1319 06/30/19 0654 07/01/19 0525  NA 135 138 141 142  K 4.5 3.9 4.2 4.4  CL 100 105 109 107  CO2 24 22 23 26   GLUCOSE 122* 151* 153* 127*  BUN 36* 32* 29* 28*  CREATININE 1.25* 1.09 0.95 0.96  CALCIUM 8.7* 8.7* 8.9 9.2  MG  --   --  2.5* 2.2  PHOS  --   --  3.4 3.4   Liver Function Tests: Recent Labs  Lab 06/26/19 0052 06/29/19 1319 06/30/19 0654 07/01/19 0525  AST 125* 77* 115* 77*  ALT 74* 99* 147* 144*  ALKPHOS 67 98 111 103  BILITOT 1.2 1.4* 1.1 0.7  PROT 7.9 7.7 6.9 7.0  ALBUMIN 3.6 3.2* 2.9* 2.8*   Recent Labs  Lab 06/26/19 0253  LIPASE 39   No results for input(s): AMMONIA in the last 168 hours. CBC: Recent Labs  Lab 06/26/19 0052 06/29/19 1319 06/30/19 0654 07/01/19 0525  WBC 7.5 7.8 6.5 9.3  NEUTROABS  5.9 6.0 4.9 7.0  HGB 13.5 13.2 12.3* 12.6*  HCT 42.6 42.2 38.9* 40.2  MCV 100.0 100.7* 99.2 99.8  PLT 155 257 271 293   Cardiac Enzymes: No results for input(s): CKTOTAL, CKMB, CKMBINDEX, TROPONINI in the last 168 hours. CBG (last 3)  No results for input(s): GLUCAP in the last 72 hours. Recent Results (from the past 240 hour(s))  SARS CORONAVIRUS 2 (TAT 6-24 HRS) Nasopharyngeal Nasopharyngeal Swab     Status: Abnormal   Collection Time: 06/29/19  1:00 PM   Specimen: Nasopharyngeal Swab  Result Value Ref  Range Status   SARS Coronavirus 2 POSITIVE (A) NEGATIVE Final    Comment: RESULT CALLED TO, READ BACK BY AND VERIFIED WITH: RN RENEE BONDURANT AT 0235 BY MESSAN HOUEGNIFIO ON 06/30/2019 (NOTE) SARS-CoV-2 target nucleic acids are DETECTED. The SARS-CoV-2 RNA is generally detectable in upper and lower respiratory specimens during the acute phase of infection. Positive results are indicative of the presence of SARS-CoV-2 RNA. Clinical correlation with patient history and other diagnostic information is  necessary to determine patient infection status. Positive results do not rule out bacterial infection or co-infection with other viruses.  The expected result is Negative. Fact Sheet for Patients: HairSlick.no Fact Sheet for Healthcare Providers: quierodirigir.com This test is not yet approved or cleared by the [place] FDA and  has been authorized for detection and/or diagnosis of SARS-CoV-2 by FDA under an Emergency Use Authorization (EUA). This EUA will remain  in effect (meaning this t est can be used) for the duration of the COVID-19 declaration under Section 564(b)(1) of the Act, 21 U.S.C. section 360bbb-3(b)(1), unless the authorization is terminated or revoked sooner. Performed at [facility], [address] [place], [place] [address]   Blood Culture (routine x 2)     Status: None (Preliminary result)   Collection Time: 06/29/19  1:19 PM   Specimen: Right Antecubital; Blood  Result Value Ref Range Status   Specimen Description   Final    RIGHT ANTECUBITAL BOTTLES DRAWN AEROBIC AND ANAEROBIC   Special Requests Blood Culture adequate volume  Final   Culture   Final    NO GROWTH 2 DAYS Performed at [facility], [address], [place], [place] [address]    Report Status PENDING  Incomplete  Blood Culture (routine x 2)     Status: None (Preliminary result)   Collection Time: 06/29/19  1:40 PM   Specimen: Left  Antecubital; Blood  Result Value Ref Range Status   Specimen Description   Final    LEFT ANTECUBITAL BOTTLES DRAWN AEROBIC AND ANAEROBIC   Special Requests Blood Culture adequate volume  Final   Culture   Final    NO GROWTH 2 DAYS Performed at [facility], [address], [place], [place] [address]    Report Status PENDING  Incomplete     Studies: No results found.   Scheduled Meds: . amLODipine  5 mg Oral Daily  . vitamin C  500 mg Oral Daily  . dexamethasone (DECADRON) injection  6 mg Intravenous Q24H  . enoxaparin (LOVENOX) injection  40 mg Subcutaneous Q24H  . feeding supplement (ENSURE ENLIVE)  237 mL Oral BID BM  . feeding supplement (PRO-STAT SUGAR FREE 64)  30 mL Oral BID  . [START ON 07/02/2019] pneumococcal 23 valent vaccine  0.5 mL Intramuscular Tomorrow-1000  . traZODone  50 mg Oral QHS  . Vitamin D (Ergocalciferol)  50,000 Units Oral Q7 days  . zinc sulfate  220  mg Oral Daily   Continuous Infusions: . remdesivir 100 mg in NS 100 mL 100 mg (07/01/19 1034)    Principal Problem:   Pneumonia due to COVID-19 virus Active Problems:   Elevated liver enzymes   Essential hypertension   Time spent:   Standley Dakins, MD [facility] 07/01/2019, 3:25 PM    LOS: 2 days  How to contact the [facility] Attending or Consulting provider 7A - 7P or covering provider during after hours 7P -7A, for this patient?  1. Check the care team in [facility] and look for a) attending/consulting TRH provider listed and b) the [facility] team listed 2. Log into [email or web address] and use [place]'s universal password to access. If you do not have the password, please contact the hospital operator. 3. Locate the [facility] provider you are looking for under [facility] and page to a number that you can be directly reached. 4. If you still have difficulty reaching the provider, please page the [facility] (Director on Call) for the Hospitalists listed on amion for assistance.

## 2019-07-01 NOTE — Progress Notes (Signed)
PT Cancellation Note  Patient Details Name: Sergio Miller MRN: [id number] DOB: Apr 21, 1951   Cancelled Treatment:    Reason Eval/Treat Not Completed: PT screened, no needs identified, will sign off.  Patient demonstrates good return for bed mobility, transfers and ambulation in room without problem.   11:45 AM, 07/01/19 Ocie Bob, MPT Physical Therapist with [facility] [phone] office [phone] mobile phone

## 2019-07-02 DIAGNOSIS — R748 Abnormal levels of other serum enzymes: Secondary | ICD-10-CM

## 2019-07-02 DIAGNOSIS — U071 COVID-19: Secondary | ICD-10-CM | POA: Diagnosis not present

## 2019-07-02 LAB — COMPREHENSIVE METABOLIC PANEL
ALT: 102 U/L — ABNORMAL HIGH (ref 0–44)
AST: 40 U/L (ref 15–41)
Albumin: 2.8 g/dL — ABNORMAL LOW (ref 3.5–5.0)
Alkaline Phosphatase: 92 U/L (ref 38–126)
Anion gap: 11 (ref 5–15)
BUN: 26 mg/dL — ABNORMAL HIGH (ref 8–23)
CO2: 23 mmol/L (ref 22–32)
Calcium: 9 mg/dL (ref 8.9–10.3)
Chloride: 106 mmol/L (ref 98–111)
Creatinine, Ser: 0.96 mg/dL (ref 0.61–1.24)
GFR calc Af Amer: >60 mL/min (ref >60)
GFR calc non Af Amer: >60 mL/min (ref >60)
Glucose, Bld: 158 mg/dL — ABNORMAL HIGH (ref 70–99)
Potassium: 4.1 mmol/L (ref 3.5–5.1)
Sodium: 140 mmol/L (ref 135–145)
Total Bilirubin: 0.7 mg/dL (ref 0.3–1.2)
Total Protein: 7.1 g/dL (ref 6.5–8.1)

## 2019-07-02 LAB — CBC WITH DIFFERENTIAL/PLATELET
Abs Immature Granulocytes: 0.31 10*3/uL — ABNORMAL HIGH (ref 0.00–0.07)
Basophils Absolute: 0 10*3/uL (ref 0.0–0.1)
Basophils Relative: 0 %
Eosinophils Absolute: 0 10*3/uL (ref 0.0–0.5)
Eosinophils Relative: 0 %
HCT: 39.8 % (ref 39.0–52.0)
Hemoglobin: 12.3 g/dL — ABNORMAL LOW (ref 13.0–17.0)
Immature Granulocytes: 3 %
Lymphocytes Relative: 12 %
Lymphs Abs: 1.1 10*3/uL (ref 0.7–4.0)
MCH: 31.1 pg (ref 26.0–34.0)
MCHC: 30.9 g/dL (ref 30.0–36.0)
MCV: 100.8 fL — ABNORMAL HIGH (ref 80.0–100.0)
Monocytes Absolute: 0.7 10*3/uL (ref 0.1–1.0)
Monocytes Relative: 7 %
Neutro Abs: 7.1 10*3/uL (ref 1.7–7.7)
Neutrophils Relative %: 78 %
Platelets: 320 10*3/uL (ref 150–400)
RBC: 3.95 MIL/uL — ABNORMAL LOW (ref 4.22–5.81)
RDW: 12 % (ref 11.5–15.5)
WBC: 9.2 10*3/uL (ref 4.0–10.5)
nRBC: 0 % (ref 0.0–0.2)

## 2019-07-02 LAB — PHOSPHORUS: Phosphorus: 3 mg/dL (ref 2.5–4.6)

## 2019-07-02 LAB — MAGNESIUM: Magnesium: 2.1 mg/dL (ref 1.7–2.4)

## 2019-07-02 LAB — FERRITIN: Ferritin: 1332 ng/mL — ABNORMAL HIGH (ref 24–336)

## 2019-07-02 LAB — C-REACTIVE PROTEIN: CRP: 8.8 mg/dL — ABNORMAL HIGH (ref <1.0)

## 2019-07-02 LAB — D-DIMER, QUANTITATIVE: D-Dimer, Quant: 2.55 ug/mL-FEU — ABNORMAL HIGH (ref 0.00–0.50)

## 2019-07-02 NOTE — Progress Notes (Signed)
PROGRESS NOTE [facility]   EDGER HUSAIN  YEM:[id number]  DOB: 1951/01/22  DOA: 06/29/2019 PCP: Patient, No Pcp Per   Brief Admission Hx: 69 year old gentleman recently tested positive for COVID-19 infection in [place] 5 days prior to arrival presented with progressive shortness of breath cough and body aches.  He was admitted with Covid pneumonia.  MDM/Assessment & Plan:   1. Covid pneumonia-patient presented with symptomatic shortness of breath, positive Covid test and abnormal chest x-ray and elevated inflammatory markers.  The patient has been started on dexamethasone and remdesivir per pharmacy.  We are following inflammatory markers daily providing supportive care and Covid isolation.  He remains SOB but slowly improving with therapy.  He is hopeful he will be strong enough to go home 3/10.   2. Elevated liver enzymes-hepatitis panel negative, LFTs trending down, continue to follow closely possibly related to acute Covid 19 illness.  3. Essential hypertension-He was on no home BP meds, I started him on amlodipine 5 mg daily and BP is improving.  4. Vitamin D deficiency - oral replacement ordered.   DVT prophylaxis: lovenox Code Status: Full Family Communication: patient updated bedside, verbalizes understanding, has full capacity Disposition Plan: Patient from home requiring IV remdesivir treatments and steroids and supportive care for COVID-19 infection when he is medically stabilized should be able to discharge home, hopefully can discharge 3/10  Consultants:    Procedures:    Antimicrobials:     Subjective: Pt reports SOB mostly with ambulation, the fever and chills is improving.   Objective: Vitals:   07/01/19 1455 07/01/19 2100 07/02/19 0300 07/02/19 1347  BP: 129/76 124/74 126/64 137/74  Pulse: 66 (!) 55 (!) 52 66  Resp: 18 20 18 18   Temp: 98.3 F (36.8 C) 97.6 F (36.4 C) 98.2 F (36.8 C) 98.6 F (37 C)  TempSrc: Oral Oral Oral Oral   SpO2: 93% 96% 95%   Weight:      Height:        Intake/Output Summary (Last 24 hours) at 07/02/2019 1756 Last data filed at 07/02/2019 1300 Gross per 24 hour  Intake 967 ml  Output --  Net 967 ml   Filed Weights   06/29/19 1249  Weight: 86 kg     REVIEW OF SYSTEMS  As per history otherwise all reviewed and reported negative  Exam:  General exam: elderly gentleman awake, alert, NAD, cooperative.  Respiratory system:  No increased work of breathing. Cardiovascular system: S1 & S2 heard. No JVD, murmurs, gallops, clicks or pedal edema. Gastrointestinal system: Abdomen is nondistended, soft and nontender. Normal bowel sounds heard. Central nervous system: Alert and oriented. No focal neurological deficits. Extremities: no CCE.  Data Reviewed: Basic Metabolic Panel: Recent Labs  Lab 06/26/19 0052 06/29/19 1319 06/30/19 0654 07/01/19 0525 07/02/19 0525  NA 135 138 141 142 140  K 4.5 3.9 4.2 4.4 4.1  CL 100 105 109 107 106  CO2 24 22 23 26 23   GLUCOSE 122* 151* 153* 127* 158*  BUN 36* 32* 29* 28* 26*  CREATININE 1.25* 1.09 0.95 0.96 0.96  CALCIUM 8.7* 8.7* 8.9 9.2 9.0  MG  --   --  2.5* 2.2 2.1  PHOS  --   --  3.4 3.4 3.0   Liver Function Tests: Recent Labs  Lab 06/26/19 0052 06/29/19 1319 06/30/19 0654 07/01/19 0525 07/02/19 0525  AST 125* 77* 115* 77* 40  ALT 74* 99* 147* 144* 102*  ALKPHOS 67 98 111 103 92  BILITOT 1.2 1.4* 1.1 0.7 0.7  PROT 7.9 7.7 6.9 7.0 7.1  ALBUMIN 3.6 3.2* 2.9* 2.8* 2.8*   Recent Labs  Lab 06/26/19 0253  LIPASE 39   No results for input(s): AMMONIA in the last 168 hours. CBC: Recent Labs  Lab 06/26/19 0052 06/29/19 1319 06/30/19 0654 07/01/19 0525 07/02/19 0525  WBC 7.5 7.8 6.5 9.3 9.2  NEUTROABS 5.9 6.0 4.9 7.0 7.1  HGB 13.5 13.2 12.3* 12.6* 12.3*  HCT 42.6 42.2 38.9* 40.2 39.8  MCV 100.0 100.7* 99.2 99.8 100.8*  PLT 155 257 271 293 320   Cardiac Enzymes: No results for input(s): CKTOTAL, CKMB, CKMBINDEX,  TROPONINI in the last 168 hours. CBG (last 3)  No results for input(s): GLUCAP in the last 72 hours. Recent Results (from the past 240 hour(s))  SARS CORONAVIRUS 2 (TAT 6-24 HRS) Nasopharyngeal Nasopharyngeal Swab     Status: Abnormal   Collection Time: 06/29/19  1:00 PM   Specimen: Nasopharyngeal Swab  Result Value Ref Range Status   SARS Coronavirus 2 POSITIVE (A) NEGATIVE Final    Comment: RESULT CALLED TO, READ BACK BY AND VERIFIED WITH: RN RENEE BONDURANT AT 0235 BY MESSAN HOUEGNIFIO ON 06/30/2019 (NOTE) SARS-CoV-2 target nucleic acids are DETECTED. The SARS-CoV-2 RNA is generally detectable in upper and lower respiratory specimens during the acute phase of infection. Positive results are indicative of the presence of SARS-CoV-2 RNA. Clinical correlation with patient history and other diagnostic information is  necessary to determine patient infection status. Positive results do not rule out bacterial infection or co-infection with other viruses.  The expected result is Negative. Fact Sheet for Patients: HairSlick.no Fact Sheet for Healthcare Providers: quierodirigir.com This test is not yet approved or cleared by the [place] FDA and  has been authorized for detection and/or diagnosis of SARS-CoV-2 by FDA under an Emergency Use Authorization (EUA). This EUA will remain  in effect (meaning this t est can be used) for the duration of the COVID-19 declaration under Section 564(b)(1) of the Act, 21 U.S.C. section 360bbb-3(b)(1), unless the authorization is terminated or revoked sooner. Performed at [facility], [address], [place], [place] [address]   Blood Culture (routine x 2)     Status: None (Preliminary result)   Collection Time: 06/29/19  1:19 PM   Specimen: Right Antecubital; Blood  Result Value Ref Range Status   Specimen Description   Final    RIGHT ANTECUBITAL BOTTLES DRAWN AEROBIC AND ANAEROBIC    Special Requests Blood Culture adequate volume  Final   Culture   Final    NO GROWTH 3 DAYS Performed at [facility], [address], [place], [place] [address]    Report Status PENDING  Incomplete  Blood Culture (routine x 2)     Status: None (Preliminary result)   Collection Time: 06/29/19  1:40 PM   Specimen: Left Antecubital; Blood  Result Value Ref Range Status   Specimen Description   Final    LEFT ANTECUBITAL BOTTLES DRAWN AEROBIC AND ANAEROBIC   Special Requests Blood Culture adequate volume  Final   Culture   Final    NO GROWTH 3 DAYS Performed at [facility], [address] [place], [place] [address]    Report Status PENDING  Incomplete     Studies: No results found.   Scheduled Meds: . amLODipine  5 mg Oral Daily  . vitamin C  500 mg Oral Daily  . dexamethasone (DECADRON) injection  6 mg Intravenous Q24H  .  enoxaparin (LOVENOX) injection  40 mg Subcutaneous Q24H  . feeding supplement (ENSURE ENLIVE)  237 mL Oral BID BM  . feeding supplement (PRO-STAT SUGAR FREE 64)  30 mL Oral BID  . traZODone  50 mg Oral QHS  . Vitamin D (Ergocalciferol)  50,000 Units Oral Q7 days  . zinc sulfate  220 mg Oral Daily   Continuous Infusions: . remdesivir 100 mg in NS 100 mL 100 mg (07/02/19 0951)    Principal Problem:   Pneumonia due to COVID-19 virus Active Problems:   Elevated liver enzymes   Essential hypertension   Time spent:   Irwin Brakeman, MD [facility] 07/02/2019, 5:56 PM    LOS: 3 days  How to contact the [facility] Attending or Consulting provider [place] or covering provider during after hours [place], for this patient?  1. Check the care team in [facility] and look for a) attending/consulting TRH provider listed and b) the [facility] team listed 2. Log into [email or web address] and use [place]'s universal password to access. If you do not have the password, please contact the hospital operator. 3. Locate the [facility] provider you are looking for under [facility] and  page to a number that you can be directly reached. 4. If you still have difficulty reaching the provider, please page the [facility] (Director on Call) for the Hospitalists listed on amion for assistance.

## 2019-07-03 LAB — COMPREHENSIVE METABOLIC PANEL
ALT: 87 U/L — ABNORMAL HIGH (ref 0–44)
AST: 34 U/L (ref 15–41)
Albumin: 2.8 g/dL — ABNORMAL LOW (ref 3.5–5.0)
Alkaline Phosphatase: 93 U/L (ref 38–126)
Anion gap: 11 (ref 5–15)
BUN: 27 mg/dL — ABNORMAL HIGH (ref 8–23)
CO2: 23 mmol/L (ref 22–32)
Calcium: 9.1 mg/dL (ref 8.9–10.3)
Chloride: 106 mmol/L (ref 98–111)
Creatinine, Ser: 0.83 mg/dL (ref 0.61–1.24)
GFR calc Af Amer: >60 mL/min (ref >60)
GFR calc non Af Amer: >60 mL/min (ref >60)
Glucose, Bld: 109 mg/dL — ABNORMAL HIGH (ref 70–99)
Potassium: 4.8 mmol/L (ref 3.5–5.1)
Sodium: 140 mmol/L (ref 135–145)
Total Bilirubin: 0.5 mg/dL (ref 0.3–1.2)
Total Protein: 6.9 g/dL (ref 6.5–8.1)

## 2019-07-03 LAB — CBC WITH DIFFERENTIAL/PLATELET
Abs Immature Granulocytes: 0.5 10*3/uL — ABNORMAL HIGH (ref 0.00–0.07)
Basophils Absolute: 0 10*3/uL (ref 0.0–0.1)
Basophils Relative: 0 %
Eosinophils Absolute: 0 10*3/uL (ref 0.0–0.5)
Eosinophils Relative: 0 %
HCT: 39.6 % (ref 39.0–52.0)
Hemoglobin: 12.4 g/dL — ABNORMAL LOW (ref 13.0–17.0)
Immature Granulocytes: 4 %
Lymphocytes Relative: 10 %
Lymphs Abs: 1.1 10*3/uL (ref 0.7–4.0)
MCH: 31.2 pg (ref 26.0–34.0)
MCHC: 31.3 g/dL (ref 30.0–36.0)
MCV: 99.7 fL (ref 80.0–100.0)
Monocytes Absolute: 0.9 10*3/uL (ref 0.1–1.0)
Monocytes Relative: 8 %
Neutro Abs: 8.7 10*3/uL — ABNORMAL HIGH (ref 1.7–7.7)
Neutrophils Relative %: 78 %
Platelets: 277 10*3/uL (ref 150–400)
RBC: 3.97 MIL/uL — ABNORMAL LOW (ref 4.22–5.81)
RDW: 12 % (ref 11.5–15.5)
WBC: 11.3 10*3/uL — ABNORMAL HIGH (ref 4.0–10.5)
nRBC: 0 % (ref 0.0–0.2)

## 2019-07-03 LAB — MAGNESIUM: Magnesium: 1.9 mg/dL (ref 1.7–2.4)

## 2019-07-03 LAB — C-REACTIVE PROTEIN: CRP: 7.6 mg/dL — ABNORMAL HIGH (ref <1.0)

## 2019-07-03 LAB — D-DIMER, QUANTITATIVE: D-Dimer, Quant: 2.39 ug/mL-FEU — ABNORMAL HIGH (ref 0.00–0.50)

## 2019-07-03 LAB — FERRITIN: Ferritin: 1026 ng/mL — ABNORMAL HIGH (ref 24–336)

## 2019-07-03 LAB — PHOSPHORUS: Phosphorus: 3.1 mg/dL (ref 2.5–4.6)

## 2019-07-03 MED ORDER — PRO-STAT SUGAR FREE PO LIQD: 30.0000 mL | Freq: Two times a day (BID) | ORAL | 0 refills | Status: AC

## 2019-07-03 MED ORDER — ALBUTEROL SULFATE HFA 108 (90 BASE) MCG/ACT IN AERS: 2.0000 | INHALATION_SPRAY | Freq: Four times a day (QID) | RESPIRATORY_TRACT | 0 refills | Status: AC | PRN

## 2019-07-03 MED ORDER — ASCORBIC ACID 500 MG PO TABS: 500.0000 mg | ORAL_TABLET | Freq: Every day | ORAL | 0 refills | Status: AC

## 2019-07-03 MED ORDER — AMLODIPINE BESYLATE 5 MG PO TABS: 5.0000 mg | ORAL_TABLET | Freq: Every day | ORAL | 2 refills | Status: AC

## 2019-07-03 MED ORDER — LIDOCAINE 5 % EX PTCH
1.0000 | MEDICATED_PATCH | Freq: Every day | CUTANEOUS | Status: DC
  Administered 2019-07-03: 1 via TRANSDERMAL
  Filled 2019-07-03: qty 1

## 2019-07-03 MED ORDER — LIDOCAINE 5 % EX PTCH: 1.0000 | MEDICATED_PATCH | Freq: Every day | CUTANEOUS | 0 refills | Status: AC

## 2019-07-03 MED ORDER — ENSURE ENLIVE PO LIQD: 237.0000 mL | Freq: Two times a day (BID) | ORAL | 12 refills | Status: AC

## 2019-07-03 MED ORDER — ACETAMINOPHEN 325 MG PO TABS
650.0000 mg | ORAL_TABLET | Freq: Four times a day (QID) | ORAL | Status: DC | PRN
  Administered 2019-07-03: 650 mg via ORAL
  Filled 2019-07-03: qty 2

## 2019-07-03 MED ORDER — ZINC SULFATE 220 (50 ZN) MG PO CAPS: 220.0000 mg | ORAL_CAPSULE | Freq: Every day | ORAL | 0 refills | Status: AC

## 2019-07-03 MED ORDER — VITAMIN D (ERGOCALCIFEROL) 1.25 MG (50000 UNIT) PO CAPS: 50000.0000 [IU] | ORAL_CAPSULE | ORAL | 0 refills | Status: AC

## 2019-07-03 MED ORDER — GUAIFENESIN-DM 100-10 MG/5ML PO SYRP: 10.0000 mL | ORAL_SOLUTION | ORAL | 0 refills | Status: AC | PRN

## 2019-07-03 MED ORDER — DEXAMETHASONE 6 MG PO TABS: 6.0000 mg | ORAL_TABLET | Freq: Every day | ORAL | 0 refills | Status: AC

## 2019-07-03 NOTE — Discharge Summary (Signed)
Physician Discharge Summary  Sergio Miller BZJ:[id number] DOB: [birth year] DOA: 06/29/2019  PCP: Patient, No Pcp Per  Admit date: 06/29/2019  Discharge date: 07/03/2019  Admitted From:Home  Disposition:  Home  Recommendations for Outpatient Follow-up:  1. Follow up with PCP in 1-2 weeks, with list of PCPs given to follow-up with 2. Continue on Decadron as prescribed for 5 more days and use albuterol inhaler and cough syrup as needed for symptomatic management 3. Continue 1 more day of IV remdesivir infusion as scheduled at 11:30 AM on 3/11 4. Continue on amlodipine and monitor blood pressure closely 5. Continue vitamin D supplementation as prescribed  Home Health: None  Equipment/Devices: None  Discharge Condition: Stable  CODE STATUS: Full  Diet recommendation: Heart Healthy  Brief/Interim Summary: 69 year old gentleman recently tested positive for COVID-19 infection in [place] 5 days prior to arrival presented with progressive shortness of breath cough and body aches.  He was admitted with Covid pneumonia.  1. Covid pneumonia-patient has completed 4 days of remdesivir as well as steroids while here with significant improvement.  He will be discharged with outpatient infusion scheduled for 3/11 for 1 more day. 2. Elevated liver enzymes-hepatitis panel negative, LFTs trending down.  Will require repeat monitoring in the outpatient setting. 3. Essential hypertension-continue amlodipine 5 mg daily with good control noted.  Follow-up with PCP. 4. Vitamin D deficiency - oral replacement ordered.   Discharge Diagnoses:  Principal Problem:   Pneumonia due to COVID-19 virus Active Problems:   Essential hypertension   Elevated liver enzymes  Principal discharge diagnosis: COVID-19 pneumonia. Marland Kitchen  Discharge Instructions  Discharge Instructions    Diet - low sodium heart healthy   Complete by: As directed    Diet - low sodium heart healthy   Complete by: As directed     Increase activity slowly   Complete by: As directed    Increase activity slowly   Complete by: As directed      Allergies as of 07/03/2019   No Known Allergies     Medication List    STOP taking these medications   lisinopril 10 MG tablet Commonly known as: ZESTRIL     TAKE these medications   acetaminophen 325 MG tablet Commonly known as: TYLENOL Take 1 tablet by mouth every 6 (six) hours as needed.   albuterol 108 (90 Base) MCG/ACT inhaler Commonly known as: VENTOLIN HFA Inhale 2 puffs into the lungs every 6 (six) hours as needed for wheezing or shortness of breath.   amLODipine 5 MG tablet Commonly known as: NORVASC Take 1 tablet (5 mg total) by mouth daily. Start taking on: July 04, 2019   ascorbic acid 500 MG tablet Commonly known as: VITAMIN C Take 1 tablet (500 mg total) by mouth daily for 15 days. Start taking on: July 04, 2019   dexamethasone 6 MG tablet Commonly known as: DECADRON Take 1 tablet (6 mg total) by mouth daily for 5 days.   feeding supplement (ENSURE ENLIVE) Liqd Take 237 mLs by mouth 2 (two) times daily between meals.   feeding supplement (PRO-STAT SUGAR FREE 64) Liqd Take 30 mLs by mouth 2 (two) times daily.   guaiFENesin-dextromethorphan 100-10 MG/5ML syrup Commonly known as: ROBITUSSIN DM Take 10 mLs by mouth every 4 (four) hours as needed for cough.   HYDROcodone-acetaminophen 5-325 MG tablet Commonly known as: NORCO/VICODIN Take 1 tablet by mouth daily as needed.   lidocaine 5 % Commonly known as: LIDODERM Place 1 patch onto the skin daily  at 6 (six) AM. Remove & Discard patch within 12 hours or as directed by MD Start taking on: July 04, 2019   Vitamin D (Ergocalciferol) 1.25 MG (50000 UNIT) Caps capsule Commonly known as: DRISDOL Take 1 capsule (50,000 Units total) by mouth every 7 (seven) days. Start taking on: July 08, 2019   zinc sulfate 220 (50 Zn) MG capsule Take 1 capsule (220 mg total) by mouth daily. Start  taking on: July 04, 2019      Follow-up Information    pcp Follow up in 1 week(s).          No Known Allergies  Consultations:  None   Procedures/Studies: CT Angio Chest PE W and/or Wo Contrast  Result Date: 06/26/2019 CLINICAL DATA:  Shortness of breath, COVID positive EXAM: CT ANGIOGRAPHY CHEST WITH CONTRAST TECHNIQUE: Multidetector CT imaging of the chest was performed using the standard protocol during bolus administration of intravenous contrast. Multiplanar CT image reconstructions and MIPs were obtained to evaluate the vascular anatomy. CONTRAST:  OMNIPAQUE IOHEXOL 350 MG/ML SOLN COMPARISON:  Radiograph same day FINDINGS: Cardiovascular: There is a optimal opacification of the pulmonary arteries. There is no central,segmental, or subsegmental filling defects within the pulmonary arteries. The heart is normal in size. No pericardial effusion or thickening. No evidence right heart strain. There is normal three-vessel brachiocephalic anatomy without proximal stenosis. The thoracic aorta is normal in appearance. Mediastinum/Nodes: No hilar, mediastinal, or axillary adenopathy. Thyroid gland, trachea, and esophagus demonstrate no significant findings. Lungs/Pleura: Multifocal ground-glass patchy airspace opacity seen throughout both lungs, predominantly within the periphery. There small left and trace right pleural effusion present. Upper Abdomen: No acute abnormalities present in the visualized portions of the upper abdomen. Musculoskeletal: No chest wall abnormality. No acute or significant osseous findings. Degenerative changes seen in the mid to lower thoracic spine. Review of the MIP images confirms the above findings. IMPRESSION: No central, segmental, or subsegmental pulmonary embolism. Multifocal ground-glass patchy airspace opacities within the periphery, consistent with COVID pneumonia. Small left and trace right pleural effusion. Electronically Signed   By: Jonna Clark M.D.    On: 06/26/2019 05:06   DG Chest Port 1 View  Result Date: 06/29/2019 CLINICAL DATA:  Increased shortness of breath, decreased appetite, unable to sleep, nausea and diarrhea since discharge. EXAM: PORTABLE CHEST 1 VIEW COMPARISON:  Chest radiograph 06/26/2019 FINDINGS: Stable cardiomediastinal contours. There are bilateral coarse interstitial opacities which appear similar to prior. No new focal pulmonary opacity. No pneumothorax or significant pleural effusion. No acute finding in the visualized skeleton. IMPRESSION: No acute cardiopulmonary finding. Appearance of the chest is similar to prior radiograph when patient was diagnosed with COVID pneumonia. Of note the pneumonia was not well appreciated radiographically. Electronically Signed   By: Emmaline Kluver M.D.   On: 06/29/2019 13:41   DG Chest Portable 1 View  Result Date: 06/26/2019 CLINICAL DATA:  Shortness of breath and COVID EXAM: PORTABLE CHEST 1 VIEW COMPARISON:  None. FINDINGS: The heart size and mediastinal contours are upper limits of normal. There is prominence of the central pulmonary vasculature. No acute osseous abnormality. IMPRESSION: Pulmonary vascular congestion. Electronically Signed   By: Jonna Clark M.D.   On: 06/26/2019 01:32     Discharge Exam: Vitals:   07/03/19 0405 07/03/19 0818  BP: (!) 147/85   Pulse: (!) 58   Resp: 18   Temp: 98.1 F (36.7 C)   SpO2: 95% 94%   Vitals:   07/02/19 1955 07/02/19 1958 07/03/19 0405 07/03/19  0818  BP: 122/73  (!) 147/85   Pulse: 66  (!) 58   Resp: 16  18   Temp: 98.3 F (36.8 C)  98.1 F (36.7 C)   TempSrc: Oral  Oral   SpO2: 95% 95% 95% 94%  Weight:      Height:        General: Pt is alert, awake, not in acute distress Cardiovascular: RRR, S1/S2 +, no rubs, no gallops Respiratory: CTA bilaterally, no wheezing, no rhonchi Abdominal: Soft, NT, ND, bowel sounds + Extremities: no edema, no cyanosis    The results of significant diagnostics from this hospitalization  (including imaging, microbiology, ancillary and laboratory) are listed below for reference.     Microbiology: Recent Results (from the past 240 hour(s))  SARS CORONAVIRUS 2 (TAT 6-24 HRS) Nasopharyngeal Nasopharyngeal Swab     Status: Abnormal   Collection Time: 06/29/19  1:00 PM   Specimen: Nasopharyngeal Swab  Result Value Ref Range Status   SARS Coronavirus 2 POSITIVE (A) NEGATIVE Final    Comment: RESULT CALLED TO, READ BACK BY AND VERIFIED WITH: RN RENEE BONDURANT AT 0235 BY MESSAN HOUEGNIFIO ON 06/30/2019 (NOTE) SARS-CoV-2 target nucleic acids are DETECTED. The SARS-CoV-2 RNA is generally detectable in upper and lower respiratory specimens during the acute phase of infection. Positive results are indicative of the presence of SARS-CoV-2 RNA. Clinical correlation with patient history and other diagnostic information is  necessary to determine patient infection status. Positive results do not rule out bacterial infection or co-infection with other viruses.  The expected result is Negative. Fact Sheet for Patients: HairSlick.no Fact Sheet for Healthcare Providers: quierodirigir.com This test is not yet approved or cleared by the [place] FDA and  has been authorized for detection and/or diagnosis of SARS-CoV-2 by FDA under an Emergency Use Authorization (EUA). This EUA will remain  in effect (meaning this t est can be used) for the duration of the COVID-19 declaration under Section 564(b)(1) of the Act, 21 U.S.C. section 360bbb-3(b)(1), unless the authorization is terminated or revoked sooner. Performed at [facility], [address], [place], [place] [address]   Blood Culture (routine x 2)     Status: None (Preliminary result)   Collection Time: 06/29/19  1:19 PM   Specimen: Right Antecubital; Blood  Result Value Ref Range Status   Specimen Description   Final    RIGHT ANTECUBITAL BOTTLES DRAWN AEROBIC AND  ANAEROBIC   Special Requests Blood Culture adequate volume  Final   Culture   Final    NO GROWTH 4 DAYS Performed at [facility], [address], [place], [place] [address]    Report Status PENDING  Incomplete  Blood Culture (routine x 2)     Status: None (Preliminary result)   Collection Time: 06/29/19  1:40 PM   Specimen: Left Antecubital; Blood  Result Value Ref Range Status   Specimen Description   Final    LEFT ANTECUBITAL BOTTLES DRAWN AEROBIC AND ANAEROBIC   Special Requests Blood Culture adequate volume  Final   Culture   Final    NO GROWTH 4 DAYS Performed at [facility], [address], [place], [place] [address]    Report Status PENDING  Incomplete     Labs: BNP (last 3 results) Recent Labs    06/26/19 0052  BNP 29.0   Basic Metabolic Panel: Recent Labs  Lab 06/29/19 1319 06/30/19 0654 07/01/19 0525 07/02/19 0525 07/03/19 0415  NA 138 141 142 140 140  K 3.9  4.2 4.4 4.1 4.8  CL 105 109 107 106 106  CO2 22 23 26 23 23   GLUCOSE 151* 153* 127* 158* 109*  BUN 32* 29* 28* 26* 27*  CREATININE 1.09 0.95 0.96 0.96 0.83  CALCIUM 8.7* 8.9 9.2 9.0 9.1  MG  --  2.5* 2.2 2.1 1.9  PHOS  --  3.4 3.4 3.0 3.1   Liver Function Tests: Recent Labs  Lab 06/29/19 1319 06/30/19 0654 07/01/19 0525 07/02/19 0525 07/03/19 0415  AST 77* 115* 77* 40 34  ALT 99* 147* 144* 102* 87*  ALKPHOS 98 111 103 92 93  BILITOT 1.4* 1.1 0.7 0.7 0.5  PROT 7.7 6.9 7.0 7.1 6.9  ALBUMIN 3.2* 2.9* 2.8* 2.8* 2.8*   No results for input(s): LIPASE, AMYLASE in the last 168 hours. No results for input(s): AMMONIA in the last 168 hours. CBC: Recent Labs  Lab 06/29/19 1319 06/30/19 0654 07/01/19 0525 07/02/19 0525 07/03/19 0415  WBC 7.8 6.5 9.3 9.2 11.3*  NEUTROABS 6.0 4.9 7.0 7.1 8.7*  HGB 13.2 12.3* 12.6* 12.3* 12.4*  HCT 42.2 38.9* 40.2 39.8 39.6  MCV 100.7* 99.2 99.8 100.8* 99.7  PLT 257 271 293 320 277   Cardiac Enzymes: No results for input(s): CKTOTAL, CKMB, CKMBINDEX,  TROPONINI in the last 168 hours. BNP: Invalid input(s): POCBNP CBG: No results for input(s): GLUCAP in the last 168 hours. D-Dimer Recent Labs    07/02/19 0525 07/03/19 0415  DDIMER 2.55* 2.39*   Hgb A1c No results for input(s): HGBA1C in the last 72 hours. Lipid Profile No results for input(s): CHOL, HDL, LDLCALC, TRIG, CHOLHDL, LDLDIRECT in the last 72 hours. Thyroid function studies No results for input(s): TSH, T4TOTAL, T3FREE, THYROIDAB in the last 72 hours.  Invalid input(s): FREET3 Anemia work up 09/02/19    07/02/19 0525 07/03/19 0415  FERRITIN 1,332* 1,026*   Urinalysis No results found for: COLORURINE, APPEARANCEUR, LABSPEC, PHURINE, GLUCOSEU, HGBUR, BILIRUBINUR, KETONESUR, PROTEINUR, UROBILINOGEN, NITRITE, LEUKOCYTESUR Sepsis Labs Invalid input(s): PROCALCITONIN,  WBC,  LACTICIDVEN Microbiology Recent Results (from the past 240 hour(s))  SARS CORONAVIRUS 2 (TAT 6-24 HRS) Nasopharyngeal Nasopharyngeal Swab     Status: Abnormal   Collection Time: 06/29/19  1:00 PM   Specimen: Nasopharyngeal Swab  Result Value Ref Range Status   SARS Coronavirus 2 POSITIVE (A) NEGATIVE Final    Comment: RESULT CALLED TO, READ BACK BY AND VERIFIED WITH: RN RENEE BONDURANT AT 0235 BY MESSAN HOUEGNIFIO ON 06/30/2019 (NOTE) SARS-CoV-2 target nucleic acids are DETECTED. The SARS-CoV-2 RNA is generally detectable in upper and lower respiratory specimens during the acute phase of infection. Positive results are indicative of the presence of SARS-CoV-2 RNA. Clinical correlation with patient history and other diagnostic information is  necessary to determine patient infection status. Positive results do not rule out bacterial infection or co-infection with other viruses.  The expected result is Negative. Fact Sheet for Patients: 08/30/2019 Fact Sheet for Healthcare Providers: HairSlick.no This test is not yet approved or  cleared by the [place] FDA and  has been authorized for detection and/or diagnosis of SARS-CoV-2 by FDA under an Emergency Use Authorization (EUA). This EUA will remain  in effect (meaning this t est can be used) for the duration of the COVID-19 declaration under Section 564(b)(1) of the Act, 21 U.S.C. section 360bbb-3(b)(1), unless the authorization is terminated or revoked sooner. Performed at [facility], [address] [place], [place]   Blood Culture (routine x 2)  Status: None (Preliminary result)   Collection Time: 06/29/19  1:19 PM   Specimen: Right Antecubital; Blood  Result Value Ref Range Status   Specimen Description   Final    RIGHT ANTECUBITAL BOTTLES DRAWN AEROBIC AND ANAEROBIC   Special Requests Blood Culture adequate volume  Final   Culture   Final    NO GROWTH 4 DAYS Performed at [facility], [address] [place], [place] [address]    Report Status PENDING  Incomplete  Blood Culture (routine x 2)     Status: None (Preliminary result)   Collection Time: 06/29/19  1:40 PM   Specimen: Left Antecubital; Blood  Result Value Ref Range Status   Specimen Description   Final    LEFT ANTECUBITAL BOTTLES DRAWN AEROBIC AND ANAEROBIC   Special Requests Blood Culture adequate volume  Final   Culture   Final    NO GROWTH 4 DAYS Performed at [facility], [address] [place], [place] [address]    Report Status PENDING  Incomplete     Time coordinating discharge: 35 minutes  SIGNED:   Erick BlinksPratik D Jalila Goodnough, DO [facility] 07/03/2019, 12:40 PM  If 7PM-7AM, please contact night-coverage [email or web address]

## 2019-07-03 NOTE — Progress Notes (Signed)
Patient scheduled for outpatient Remdesivir infusion at 11:30 AM on Thursday 3/11.  Please advise them to report to [facility] at [address]  Drive to the security guard and tell them you are here for an infusion. They will direct you to the front entrance where we will come and get you.  For questions call [phone].  Thanks

## 2019-07-03 NOTE — Clinical Social Work Note (Signed)
Per attending request, patient needs list of PCPs. List of PCPs printed to department 300. Spoke with RN, Nada Libman, who agreed to place list in patient's discharge paperwork.    Malcomb Gangemi, Juleen China, LCSW

## 2019-07-03 NOTE — Discharge Instructions (Signed)
You are scheduled for an outpatient infusion of Remdesivir at 11:30 AM on Thursday 3/11. Please report to [facility] at [address].  Drive to the security guard and tell them you are here for an infusion. They will direct you to the front entrance where we will come and get you.  For questions call [phone].  Thanks

## 2019-07-04 ENCOUNTER — Ambulatory Visit (HOSPITAL_COMMUNITY)
Admission: RE | Admit: 2019-07-04 | Discharge: 2019-07-04 | Disposition: A | Discharge status: Home / Self Care | Payer: No Typology Code available for payment source | Source: Ambulatory Visit | Attending: Pulmonary Disease | Admitting: Pulmonary Disease

## 2019-07-04 DIAGNOSIS — U071 COVID-19: Secondary | ICD-10-CM | POA: Insufficient documentation

## 2019-07-04 LAB — CULTURE, BLOOD (ROUTINE X 2)
Culture: NO GROWTH
Culture: NO GROWTH
Special Requests: ADEQUATE
Special Requests: ADEQUATE

## 2019-07-04 MED ORDER — METHYLPREDNISOLONE SODIUM SUCC 125 MG IJ SOLR: 125.0000 mg | Freq: Once | INTRAMUSCULAR | Status: DC | PRN

## 2019-07-04 MED ORDER — EPINEPHRINE 0.3 MG/0.3ML IJ SOAJ: 0.3000 mg | Freq: Once | INTRAMUSCULAR | Status: DC | PRN

## 2019-07-04 MED ORDER — ALBUTEROL SULFATE HFA 108 (90 BASE) MCG/ACT IN AERS: 2.0000 | INHALATION_SPRAY | Freq: Once | RESPIRATORY_TRACT | Status: DC | PRN

## 2019-07-04 MED ORDER — SODIUM CHLORIDE 0.9 % IV SOLN: INTRAVENOUS | Status: DC | PRN

## 2019-07-04 MED ORDER — DIPHENHYDRAMINE HCL 50 MG/ML IJ SOLN: 50.0000 mg | Freq: Once | INTRAMUSCULAR | Status: DC | PRN

## 2019-07-04 MED ORDER — SODIUM CHLORIDE 0.9 % IV SOLN
100.0000 mg | Freq: Once | INTRAVENOUS | Status: AC
  Administered 2019-07-04: 100 mg via INTRAVENOUS
  Filled 2019-07-04: qty 20

## 2019-07-04 MED ORDER — FAMOTIDINE IN NACL 20-0.9 MG/50ML-% IV SOLN: 20.0000 mg | Freq: Once | INTRAVENOUS | Status: DC | PRN

## 2019-07-04 NOTE — Discharge Instructions (Signed)

## 2019-07-04 NOTE — Progress Notes (Signed)
  Diagnosis: COVID-19  Physician:dr wright  Procedure: [facility] Med: remdesivir infusion.  Complications: No immediate complications noted.  Discharge: Discharged home   Sergio Miller S [name] 07/04/2019

## 2019-09-02 ENCOUNTER — Emergency Department (HOSPITAL_COMMUNITY)
Admission: EM | Admit: 2019-09-02 | Discharge: 2019-09-02 | Disposition: A | Discharge status: Home / Self Care | Payer: No Typology Code available for payment source | Attending: Emergency Medicine | Admitting: Emergency Medicine

## 2019-09-02 ENCOUNTER — Other Ambulatory Visit: Payer: Self-pay

## 2019-09-02 ENCOUNTER — Emergency Department (HOSPITAL_COMMUNITY): Payer: No Typology Code available for payment source

## 2019-09-02 ENCOUNTER — Encounter (HOSPITAL_COMMUNITY): Payer: Self-pay | Admitting: Emergency Medicine

## 2019-09-02 DIAGNOSIS — Y999 Unspecified external cause status: Secondary | ICD-10-CM | POA: Diagnosis not present

## 2019-09-02 DIAGNOSIS — Z87891 Personal history of nicotine dependence: Secondary | ICD-10-CM | POA: Diagnosis not present

## 2019-09-02 DIAGNOSIS — Y9289 Other specified places as the place of occurrence of the external cause: Secondary | ICD-10-CM | POA: Insufficient documentation

## 2019-09-02 DIAGNOSIS — I10 Essential (primary) hypertension: Secondary | ICD-10-CM | POA: Diagnosis not present

## 2019-09-02 DIAGNOSIS — S60512A Abrasion of left hand, initial encounter: Secondary | ICD-10-CM | POA: Insufficient documentation

## 2019-09-02 DIAGNOSIS — W11XXXA Fall on and from ladder, initial encounter: Secondary | ICD-10-CM | POA: Diagnosis not present

## 2019-09-02 DIAGNOSIS — M79602 Pain in left arm: Secondary | ICD-10-CM | POA: Insufficient documentation

## 2019-09-02 DIAGNOSIS — Y939 Activity, unspecified: Secondary | ICD-10-CM | POA: Insufficient documentation

## 2019-09-02 DIAGNOSIS — Z79899 Other long term (current) drug therapy: Secondary | ICD-10-CM | POA: Diagnosis not present

## 2019-09-02 HISTORY — DX: Essential (primary) hypertension: I10

## 2019-09-02 LAB — CBC WITH DIFFERENTIAL/PLATELET
Abs Immature Granulocytes: 0.04 10*3/uL (ref 0.00–0.07)
Basophils Absolute: 0 10*3/uL (ref 0.0–0.1)
Basophils Relative: 0 %
Eosinophils Absolute: 0.2 10*3/uL (ref 0.0–0.5)
Eosinophils Relative: 1 %
HCT: 42.5 % (ref 39.0–52.0)
Hemoglobin: 13.6 g/dL (ref 13.0–17.0)
Immature Granulocytes: 0 %
Lymphocytes Relative: 19 %
Lymphs Abs: 2.2 10*3/uL (ref 0.7–4.0)
MCH: 32.3 pg (ref 26.0–34.0)
MCHC: 32 g/dL (ref 30.0–36.0)
MCV: 101 fL — ABNORMAL HIGH (ref 80.0–100.0)
Monocytes Absolute: 1.2 10*3/uL — ABNORMAL HIGH (ref 0.1–1.0)
Monocytes Relative: 10 %
Neutro Abs: 8.1 10*3/uL — ABNORMAL HIGH (ref 1.7–7.7)
Neutrophils Relative %: 70 %
Platelets: 232 10*3/uL (ref 150–400)
RBC: 4.21 MIL/uL — ABNORMAL LOW (ref 4.22–5.81)
RDW: 12.7 % (ref 11.5–15.5)
WBC: 11.7 10*3/uL — ABNORMAL HIGH (ref 4.0–10.5)
nRBC: 0 % (ref 0.0–0.2)

## 2019-09-02 LAB — BASIC METABOLIC PANEL
Anion gap: 9 (ref 5–15)
BUN: 16 mg/dL (ref 8–23)
CO2: 26 mmol/L (ref 22–32)
Calcium: 9.3 mg/dL (ref 8.9–10.3)
Chloride: 103 mmol/L (ref 98–111)
Creatinine, Ser: 0.84 mg/dL (ref 0.61–1.24)
GFR calc Af Amer: >60 mL/min (ref >60)
GFR calc non Af Amer: >60 mL/min (ref >60)
Glucose, Bld: 97 mg/dL (ref 70–99)
Potassium: 4.5 mmol/L (ref 3.5–5.1)
Sodium: 138 mmol/L (ref 135–145)

## 2019-09-02 LAB — C-REACTIVE PROTEIN: CRP: 3.7 mg/dL — ABNORMAL HIGH (ref <1.0)

## 2019-09-02 LAB — SEDIMENTATION RATE: Sed Rate: 48 mm/hr — ABNORMAL HIGH (ref 0–16)

## 2019-09-02 MED ORDER — ACETAMINOPHEN 500 MG PO TABS: 1000.0000 mg | ORAL_TABLET | Freq: Once | ORAL | Status: DC

## 2019-09-02 MED ORDER — TETANUS-DIPHTH-ACELL PERTUSSIS 5-2.5-18.5 LF-MCG/0.5 IM SUSP: 0.5000 mL | Freq: Once | INTRAMUSCULAR | Status: DC

## 2019-09-02 MED ORDER — PREDNISONE 10 MG (21) PO TBPK: ORAL_TABLET | Freq: Every day | ORAL | 0 refills | Status: AC

## 2019-09-02 MED ORDER — AMLODIPINE BESYLATE 5 MG PO TABS: 5.0000 mg | ORAL_TABLET | Freq: Once | ORAL | Status: DC

## 2019-09-02 MED ORDER — AMOXICILLIN-POT CLAVULANATE 875-125 MG PO TABS: 1.0000 | ORAL_TABLET | Freq: Once | ORAL | Status: DC

## 2019-09-02 MED ORDER — OXYCODONE-ACETAMINOPHEN 5-325 MG PO TABS: 1.0000 | ORAL_TABLET | Freq: Four times a day (QID) | ORAL | 0 refills | Status: AC | PRN

## 2019-09-02 MED ORDER — BACITRACIN ZINC 500 UNIT/GM EX OINT: TOPICAL_OINTMENT | Freq: Two times a day (BID) | CUTANEOUS | Status: DC

## 2019-09-02 MED ORDER — OXYCODONE-ACETAMINOPHEN 5-325 MG PO TABS
1.0000 | ORAL_TABLET | Freq: Once | ORAL | Status: AC
  Administered 2019-09-02: 17:00:00 1 via ORAL
  Filled 2019-09-02: qty 1

## 2019-09-02 NOTE — ED Provider Notes (Deleted)
[facility] EMERGENCY DEPARTMENT Provider Note   CSN: [id number] Arrival date & time: 09/02/19  7829     History Chief Complaint  Patient presents with  . Arm Pain    Sergio Miller is a 69 y.o. male.  Patient c/o pain to left hand for the past couple weeks. States he had missed a step on ladder, and fallen onto hand. Abrasions to dorsum of hand. C/o constant, dull, moderate, persistent, non radiating pain to hand. No acute or abrupt change today, but rather persistence of symptoms. No fever or chills. Indicates does not feel sick or ill. Normal appetite. No nausea or vomiting. Pt unsure of most recent tetanus. Denies other pain or injury. No loc, head injury or headache. No neck or back pain. No other extremity injury or pain. Denies numbness or weakness.   The history is provided by the patient.  Arm Pain Pertinent negatives include no chest pain, no abdominal pain, no headaches and no shortness of breath.       Past Medical History:  Diagnosis Date  . Hypertension     Patient Active Problem List   Diagnosis Date Noted  . Essential hypertension 06/30/2019  . Elevated liver enzymes 06/30/2019  . Pneumonia due to COVID-19 virus 06/29/2019    Past Surgical History:  Procedure Laterality Date  . NECK SURGERY         History reviewed. No pertinent family history.  Social History   Tobacco Use  . Smoking status: Former Research scientist (life sciences)  . Smokeless tobacco: Never Used  Substance Use Topics  . Alcohol use: Not Currently  . Drug use: Not Currently    Home Medications Prior to Admission medications   Medication Sig Start Date End Date Taking? Authorizing Provider  acetaminophen (TYLENOL) 325 MG tablet Take 1 tablet by mouth every 6 (six) hours as needed. 05/19/17   [provider]  albuterol (VENTOLIN HFA) 108 (90 Base) MCG/ACT inhaler Inhale 2 puffs into the lungs every 6 (six) hours as needed for wheezing or shortness of breath. 07/03/19   Manuella Ghazi, Pratik D, DO    Amino Acids-Protein Hydrolys (FEEDING SUPPLEMENT, PRO-STAT SUGAR FREE 64,) LIQD Take 30 mLs by mouth 2 (two) times daily. 07/03/19   Manuella Ghazi, Pratik D, DO  amLODipine (NORVASC) 5 MG tablet Take 1 tablet (5 mg total) by mouth daily. 07/04/19 08/03/19  Manuella Ghazi, Pratik D, DO  feeding supplement, ENSURE ENLIVE, (ENSURE ENLIVE) LIQD Take 237 mLs by mouth 2 (two) times daily between meals. 07/03/19   Manuella Ghazi, Pratik D, DO  guaiFENesin-dextromethorphan (ROBITUSSIN DM) 100-10 MG/5ML syrup Take 10 mLs by mouth every 4 (four) hours as needed for cough. 07/03/19   Manuella Ghazi, Pratik D, DO  HYDROcodone-acetaminophen (NORCO/VICODIN) 5-325 MG tablet Take 1 tablet by mouth daily as needed.    [provider]  lidocaine (LIDODERM) 5 % Place 1 patch onto the skin daily at 6 (six) AM. Remove & Discard patch within 12 hours or as directed by MD 07/04/19   Manuella Ghazi, Pratik D, DO  Vitamin D, Ergocalciferol, (DRISDOL) 1.25 MG (50000 UNIT) CAPS capsule Take 1 capsule (50,000 Units total) by mouth every 7 (seven) days. 07/08/19   Heath Lark D, DO    Allergies    Patient has no known allergies.  Review of Systems   Review of Systems  Constitutional: Negative for chills, diaphoresis and fever.  HENT: Negative for sore throat.   Eyes: Negative for redness.  Respiratory: Negative for shortness of breath.   Cardiovascular: Negative for chest  pain.  Gastrointestinal: Negative for abdominal pain.  Genitourinary: Negative for flank pain.  Musculoskeletal: Negative for back pain and neck pain.  Skin: Positive for wound.  Neurological: Negative for numbness and headaches.  Hematological: Does not bruise/bleed easily.  Psychiatric/Behavioral: Negative for confusion.    Physical Exam Updated Vital Signs BP (!) 201/104 (BP Location: Right Arm) Comment: pt reports is on bp medication but reports needs medication changed.  Pulse 82   Temp 98.2 F (36.8 C) (Oral)   Resp 18   Ht 1.803 m (5\' 11" )   Wt 80.7 kg   SpO2 99%   BMI 24.83  kg/m   Physical Exam Vitals and nursing note reviewed.  Constitutional:      Appearance: Normal appearance. He is well-developed.  HENT:     Head: Atraumatic.     Nose: Nose normal.     Mouth/Throat:     Mouth: Mucous membranes are moist.     Pharynx: Oropharynx is clear.  Eyes:     General: No scleral icterus.    Conjunctiva/sclera: Conjunctivae normal.     Pupils: Pupils are equal, round, and reactive to light.  Neck:     Trachea: No tracheal deviation.  Cardiovascular:     Rate and Rhythm: Normal rate and regular rhythm.     Pulses: Normal pulses.     Heart sounds: Normal heart sounds. No murmur. No friction rub. No gallop.   Pulmonary:     Effort: Pulmonary effort is normal. No accessory muscle usage or respiratory distress.     Breath sounds: Normal breath sounds.  Abdominal:     General: There is no distension.     Palpations: Abdomen is soft.     Tenderness: There is no abdominal tenderness.  Genitourinary:    Comments: No cva tenderness. Musculoskeletal:     Cervical back: Normal range of motion and neck supple. No rigidity.     Comments: V mild swelling to dorsum left hand. Skin is mildly warm and erythematous. +tenderness to dorsum left hand. Abrasions to dorsum hand - see photo. Radial pulse 2+, normal cap refill distally. Good active and passive rom digits without pain, no tenosynovitis. No lymphangitis, l/a, pain, swelling or tenderness proximal to hand.   Skin:    General: Skin is warm and dry.     Findings: No rash.  Neurological:     Mental Status: He is alert.     Comments: Alert, speech clear. LUE/hand, nvi, with rad/med/uln fxn, motor and sens, intact.   Psychiatric:        Mood and Affect: Mood normal.       ED Results / Procedures / Treatments   Labs (all labs ordered are listed, but only abnormal results are displayed) Labs Reviewed - No data to display  EKG None  Radiology DG Shoulder Left  Result Date: 09/02/2019 CLINICAL DATA:  Pain  and limited range of motion EXAM: LEFT SHOULDER - 2+ VIEW COMPARISON:  None. FINDINGS: Oblique and Y scapular images were obtained. No fracture or dislocation. There is mild generalized joint space narrowing. No erosive change or intra-articular calcification. Visualized left lung clear. IMPRESSION: Mild generalized osteoarthritic change.  No fracture or dislocation. Electronically Signed   By: 11/02/2019 III M.D.   On: 09/02/2019 10:22    Procedures Procedures (including critical care time)  Medications Ordered in ED Medications  Tdap (BOOSTRIX) injection 0.5 mL (has no administration in time range)  amoxicillin-clavulanate (AUGMENTIN) 875-125 MG per tablet 1 tablet (has  no administration in time range)  bacitracin ointment (has no administration in time range)    ED Course  I have reviewed the triage vital signs and the nursing notes.  Pertinent labs & imaging results that were available during my care of the patient were reviewed by me and considered in my medical decision making (see chart for details).    MDM Rules/Calculators/A&P                     Xrays.   Confirmed nkda w pt.   Wounds cleaned, bacitracin and sterile dressing. Tetanus im.  Acetaminophen po.   Augmentin po.   Recheck bp high. No headache. No cp or sob. No swelling. Pt given dose of his bp med.   Reviewed nursing notes and prior charts for additional history.   Xrays reviewed/interpreted by me - no fx. Discussed w pt.   Pt appears well, afebrile, does not appear acutely ill. No rapid progression of symptoms, but rather persistent/slowly worse.   rx for home. pcp recheck 1-2 days. Also have bp rechecked then as high today.   Return precautions provided.    NOTE: after all documentation complete, staff indicates they roomed wrong patient in Epic - the documentation above pertains to this other patient, and not to Mr Assefa. This note under Mr Bassford will be deleted.      Final Clinical  Impression(s) / ED Diagnoses Final diagnoses:  None    Rx / DC Orders ED Discharge Orders    None        Cathren Laine, MD 09/02/19 1320

## 2019-09-02 NOTE — ED Notes (Signed)
Orders entered on wrong pt. EDP aware.

## 2019-09-02 NOTE — ED Triage Notes (Signed)
Pt reports left arm pain, LUE swelling, and limited ROM x3 weeks. Pt reports increase in pain since last night. Pt reports pain started in left hand and reports pain is moving up left arm. Pt denies numbness, changes in vision, ambulation or speech. nad noted.   Pt reports cervical injury in 2019. Pt denies any recent injury.

## 2019-09-02 NOTE — Discharge Instructions (Addendum)
You are seen in the emergency department for worsening left arm pain.  You had a CAT scan of your cervical spine that showed some arthritis but that your lumbar fusion was stable.  Your lab work did not show any serious abnormalities.  We are prescribing you some steroids to take to decrease the inflammation which may help your pain.  We are also prescribing you some pain medicine to use short-term.  Please contact your primary care doctor for close follow-up.  Return to the emergency department if any worsening or concerning symptoms

## 2019-09-02 NOTE — ED Provider Notes (Signed)
[facility] EMERGENCY DEPARTMENT Provider Note   CSN: [id number] Arrival date & time: 09/02/19  7893     History Chief Complaint  Patient presents with  . Arm Pain    Sergio Miller is a 69 y.o. male.  He is complaining of pain in his left arm that is been going on for over a month.  For the last 2 weeks has been severe.  Hurts with any type of movement.  Unclear if there is weakness or pain that is limiting him.  No trauma.  He has a remote cervical fusion 20 years ago and a more recent spinal fracture that was treated conservatively 2 years ago.  It sounds like he was prescribed some Neurontin without any improvement in his pain.  No other neurologic symptoms.  No neck pain.  The history is provided by the patient.  Arm Pain This is a new problem. The current episode started more than 1 week ago. The problem occurs constantly. The problem has been gradually worsening. Pertinent negatives include no chest pain, no abdominal pain, no headaches and no shortness of breath. The symptoms are aggravated by bending and twisting. Nothing relieves the symptoms. He has tried nothing for the symptoms. The treatment provided no relief.       Past Medical History:  Diagnosis Date  . Hypertension     Patient Active Problem List   Diagnosis Date Noted  . Essential hypertension 06/30/2019  . Elevated liver enzymes 06/30/2019  . Pneumonia due to COVID-19 virus 06/29/2019    Past Surgical History:  Procedure Laterality Date  . NECK SURGERY         History reviewed. No pertinent family history.  Social History   Tobacco Use  . Smoking status: Former Research scientist (life sciences)  . Smokeless tobacco: Never Used  Substance Use Topics  . Alcohol use: Not Currently  . Drug use: Not Currently    Home Medications Prior to Admission medications   Medication Sig Start Date End Date Taking? Authorizing Provider  acetaminophen (TYLENOL) 325 MG tablet Take 1 tablet by mouth every 6 (six) hours as needed.  05/19/17   [provider]  albuterol (VENTOLIN HFA) 108 (90 Base) MCG/ACT inhaler Inhale 2 puffs into the lungs every 6 (six) hours as needed for wheezing or shortness of breath. 07/03/19   Manuella Ghazi, Pratik D, DO  Amino Acids-Protein Hydrolys (FEEDING SUPPLEMENT, PRO-STAT SUGAR FREE 64,) LIQD Take 30 mLs by mouth 2 (two) times daily. 07/03/19   Manuella Ghazi, Pratik D, DO  amLODipine (NORVASC) 5 MG tablet Take 1 tablet (5 mg total) by mouth daily. 07/04/19 08/03/19  Manuella Ghazi, Pratik D, DO  feeding supplement, ENSURE ENLIVE, (ENSURE ENLIVE) LIQD Take 237 mLs by mouth 2 (two) times daily between meals. 07/03/19   Manuella Ghazi, Pratik D, DO  guaiFENesin-dextromethorphan (ROBITUSSIN DM) 100-10 MG/5ML syrup Take 10 mLs by mouth every 4 (four) hours as needed for cough. 07/03/19   Manuella Ghazi, Pratik D, DO  HYDROcodone-acetaminophen (NORCO/VICODIN) 5-325 MG tablet Take 1 tablet by mouth daily as needed.    [provider]  lidocaine (LIDODERM) 5 % Place 1 patch onto the skin daily at 6 (six) AM. Remove & Discard patch within 12 hours or as directed by MD 07/04/19   Manuella Ghazi, Pratik D, DO  Vitamin D, Ergocalciferol, (DRISDOL) 1.25 MG (50000 UNIT) CAPS capsule Take 1 capsule (50,000 Units total) by mouth every 7 (seven) days. 07/08/19   Heath Lark D, DO    Allergies    Patient has no  known allergies.  Review of Systems   Review of Systems  Constitutional: Negative for fever.  HENT: Negative for sore throat.   Eyes: Negative for visual disturbance.  Respiratory: Negative for shortness of breath.   Cardiovascular: Negative for chest pain.  Gastrointestinal: Negative for abdominal pain.  Genitourinary: Negative for dysuria.  Musculoskeletal: Negative for neck pain.  Skin: Negative for rash.  Neurological: Negative for headaches.    Physical Exam Updated Vital Signs BP (!) 165/98   Pulse 77   Temp 98.2 F (36.8 C) (Oral)   Resp 16   Ht 5\' 11"  (1.803 m)   Wt 80.7 kg   SpO2 97%   BMI 24.83 kg/m   Physical  Exam Vitals and nursing note reviewed.  Constitutional:      Appearance: He is well-developed.  HENT:     Head: Normocephalic and atraumatic.  Eyes:     Conjunctiva/sclera: Conjunctivae normal.  Cardiovascular:     Rate and Rhythm: Normal rate and regular rhythm.     Heart sounds: No murmur.  Pulmonary:     Effort: Pulmonary effort is normal. No respiratory distress.     Breath sounds: Normal breath sounds.  Abdominal:     Palpations: Abdomen is soft.     Tenderness: There is no abdominal tenderness.  Musculoskeletal:        General: Tenderness present. No swelling, deformity or signs of injury.     Cervical back: Neck supple.     Comments: Patient has diffuse tenderness through his hand wrist forearm biceps.  Normal sensation.  Pain with any kind of palpation or manipulation of his joints.  Cap refill brisk and radial pulse 2+.  No particular swelling.  No neck tenderness.  Skin:    General: Skin is warm and dry.     Capillary Refill: Capillary refill takes less than 2 seconds.  Neurological:     General: No focal deficit present.     Mental Status: He is alert.     Sensory: No sensory deficit.     ED Results / Procedures / Treatments   Labs (all labs ordered are listed, but only abnormal results are displayed) Labs Reviewed  CBC WITH DIFFERENTIAL/PLATELET - Abnormal; Notable for the following components:      Result Value   WBC 11.7 (*)    RBC 4.21 (*)    MCV 101.0 (*)    Neutro Abs 8.1 (*)    Monocytes Absolute 1.2 (*)    All other components within normal limits  SEDIMENTATION RATE - Abnormal; Notable for the following components:   Sed Rate 48 (*)    All other components within normal limits  C-REACTIVE PROTEIN - Abnormal; Notable for the following components:   CRP 3.7 (*)    All other components within normal limits  BASIC METABOLIC PANEL    EKG None  Radiology CT Cervical Spine Wo Contrast  Result Date: 09/02/2019 CLINICAL DATA:  Left arm pain and  left upper extremity swelling. EXAM: CT CERVICAL SPINE WITHOUT CONTRAST TECHNIQUE: Multidetector CT imaging of the cervical spine was performed without intravenous contrast. Multiplanar CT image reconstructions were also generated. COMPARISON:  Sep 16, 2009 FINDINGS: Alignment: Normal. Skull base and vertebrae: No acute fracture. No primary bone lesion or focal pathologic process. Soft tissues and spinal canal: No prevertebral fluid or swelling. No visible canal hematoma. Disc levels: Mild to moderate severity endplate sclerosis and moderate severity intervertebral disc space narrowing is seen at the level of C2-C3. A metallic  density fusion plate and screws are seen along the anterior aspects of the C3, C4 and C5 vertebral bodies. Evidence of prior cervical fusion is seen at the levels of C5-C6 and C6-C7. Upper chest: Mild scarring and/or atelectasis is seen within the bilateral apices. Other: None. IMPRESSION: 1. 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 2. Evidence of cervical fusion at the levels of C3-C4, C4-C5, C5-C6 and C6-C7. Electronically Signed   By: Aram Candela M.D.   On: 09/02/2019 17:06   DG Shoulder Left  Result Date: 09/02/2019 CLINICAL DATA:  Pain and limited range of motion EXAM: LEFT SHOULDER - 2+ VIEW COMPARISON:  None. FINDINGS: Oblique and Y scapular images were obtained. No fracture or dislocation. There is mild generalized joint space narrowing. No erosive change or intra-articular calcification. Visualized left lung clear. IMPRESSION: Mild generalized osteoarthritic change.  No fracture or dislocation. Electronically Signed   By: Bretta Bang III M.D.   On: 09/02/2019 10:22    Procedures Procedures (including critical care time)  Medications Ordered in ED Medications  oxyCODONE-acetaminophen (PERCOCET/ROXICET) 5-325 MG per tablet 1 tablet (has no administration in time range)    ED Course  I have reviewed the triage vital signs and the  nursing notes.  Pertinent labs & imaging results that were available during my care of the patient were reviewed by me and considered in my medical decision making (see chart for details).    MDM Rules/Calculators/A&P                     This patient complains of left arm pain; this involves an extensive number of treatment Options and is a complaint that carries with it a high risk of complications and Morbidity. The differential includes musculoskeletal, arthritis, radiculopathy, vascular  I ordered, reviewed and interpreted labs, which included mildly elevated white count of 11.7.  Normal chemistries.  CRP and sed rate are elevated I ordered medication oxycodone with some improvement in his pain I ordered imaging studies which included left shoulder x-ray and cervical spine CT and I independently    visualized and interpreted imaging which showed no acute fractures, does have some degenerative disease Previous records obtained and reviewed in epic  After the interventions stated above, I reevaluated the patient and found patient to be somewhat improved in his pain.  We discussed the possible causes of his symptoms.  I will place him on some pain medicine and steroids.  He is planning to go to the [place] in [place] where he gets his care.  Return instructions discussed.   Final Clinical Impression(s) / ED Diagnoses Final diagnoses:  Left arm pain    Rx / DC Orders ED Discharge Orders         Ordered    predniSONE (STERAPRED UNI-PAK 21 TAB) 10 MG (21) TBPK tablet  Daily     09/02/19 1822    oxyCODONE-acetaminophen (PERCOCET/ROXICET) 5-325 MG tablet  Every 6 hours PRN     09/02/19 1822           Terrilee Files, MD 09/03/19 1021

## 2019-09-02 NOTE — ED Notes (Signed)
Dr Butler at bedside.  

## 2020-04-30 ENCOUNTER — Other Ambulatory Visit: Payer: Self-pay

## 2020-04-30 ENCOUNTER — Emergency Department (HOSPITAL_COMMUNITY)
Admission: EM | Admit: 2020-04-30 | Discharge: 2020-04-30 | Disposition: A | Discharge status: Home / Self Care | Payer: No Typology Code available for payment source | Attending: Emergency Medicine | Admitting: Emergency Medicine

## 2020-04-30 ENCOUNTER — Encounter (HOSPITAL_COMMUNITY): Payer: Self-pay | Admitting: Emergency Medicine

## 2020-04-30 DIAGNOSIS — I10 Essential (primary) hypertension: Secondary | ICD-10-CM | POA: Diagnosis not present

## 2020-04-30 DIAGNOSIS — Z5321 Procedure and treatment not carried out due to patient leaving prior to being seen by health care provider: Secondary | ICD-10-CM | POA: Diagnosis not present

## 2020-04-30 HISTORY — DX: Unspecified osteoarthritis, unspecified site: M19.90

## 2020-04-30 NOTE — ED Triage Notes (Signed)
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 Pt is on medication but has been taking it incorrectly.  Pt had stopped taking the medication because it made him feel bad.

## 2021-01-23 IMAGING — CT CT CERVICAL SPINE W/O CM
3 of 4 series · 13 of 33 positions shown, 16 images · non-contrast
Comparison: September 16, 2009

CLINICAL DATA: Left arm pain and left upper extremity swelling.

EXAM:
CT CERVICAL SPINE WITHOUT CONTRAST
TECHNIQUE: Multidetector CT imaging of the cervical spine was performed without
intravenous contrast. Multiplanar CT image reconstructions were also
generated.

[Series 5: sag bone · sagittal · 0.33mm/px · 5 of 103 slices shown, 6 images]
[im 35/103  bone]
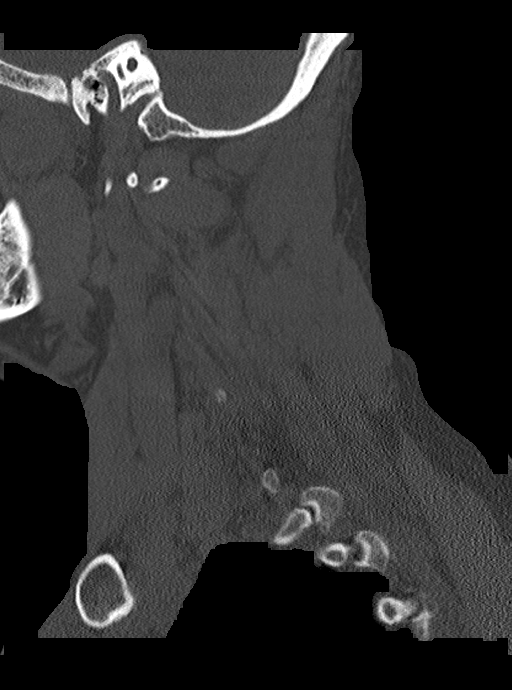
[im 43/103  bone]
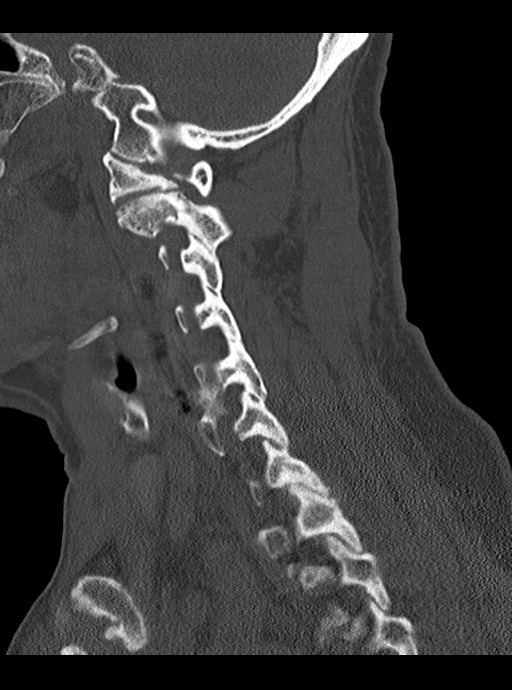
[im 52/103  soft-tissue]
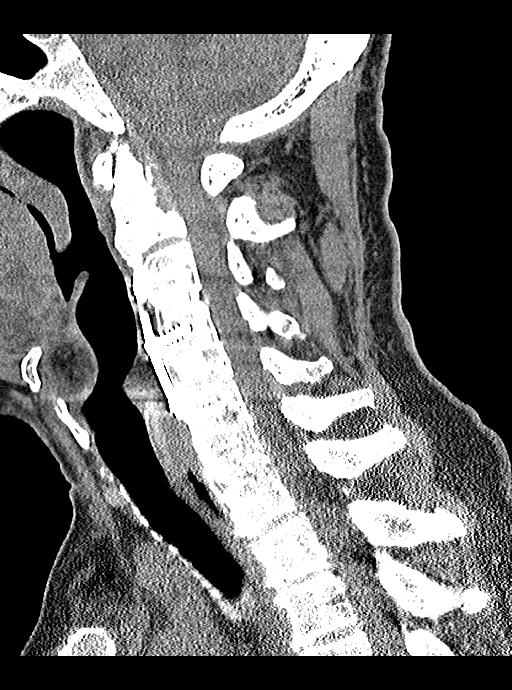
[im 52/103  bone]
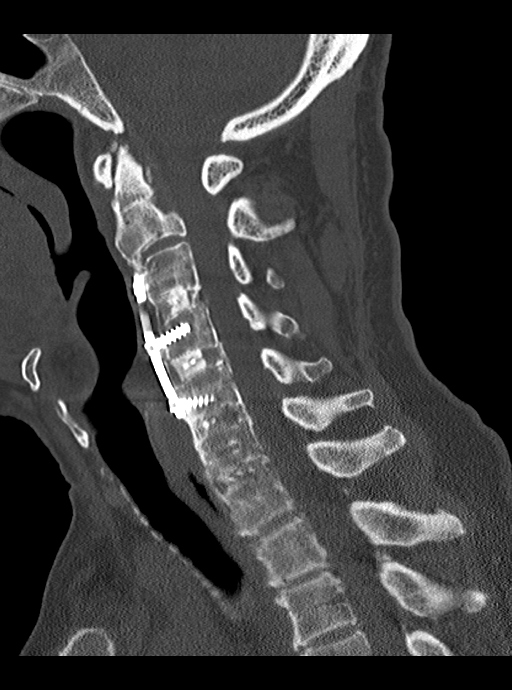
[im 60/103  bone]
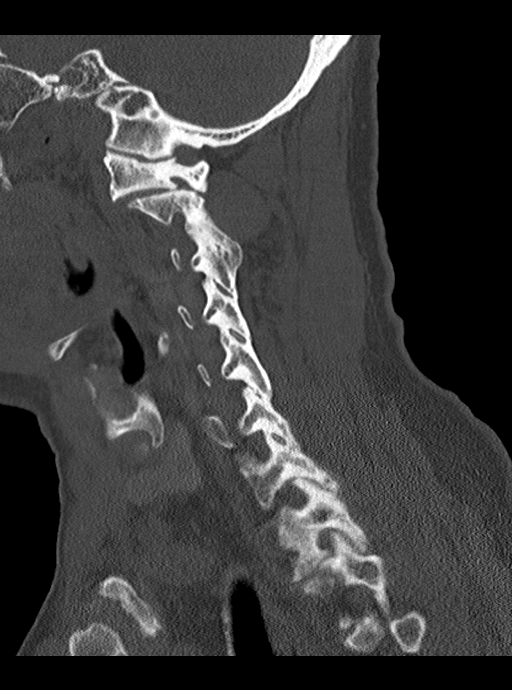
[im 69/103  bone]
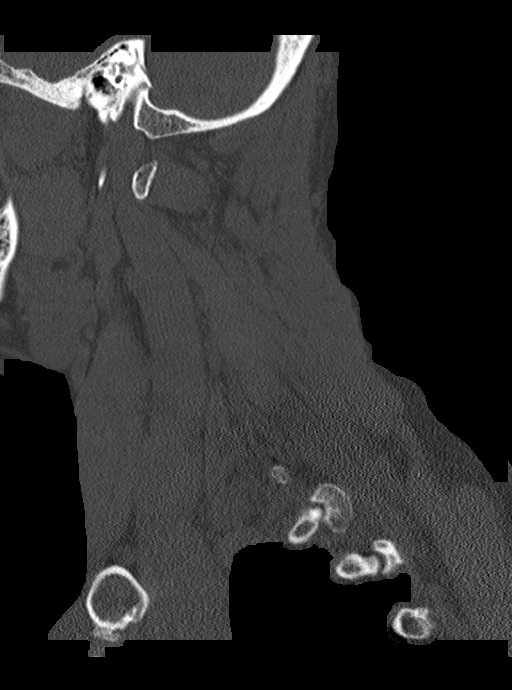

[Series 6: cor bone · coronal · 0.41mm/px · 3 of 76 slices shown]
[im 19/76  bone]
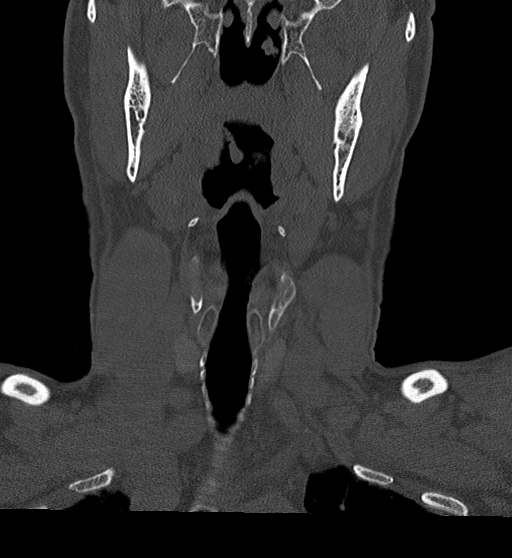
[im 32/76  bone]
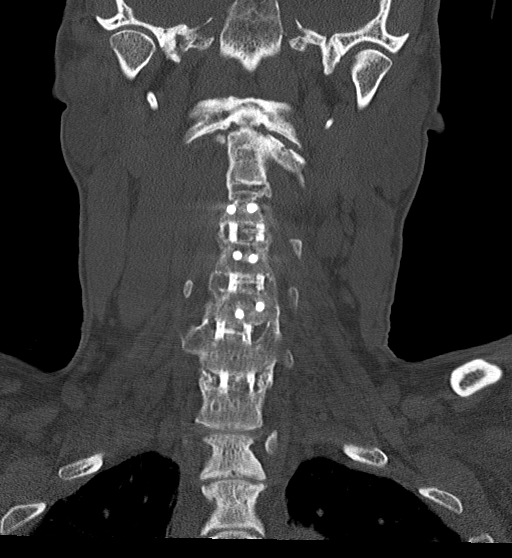
[im 44/76  bone]
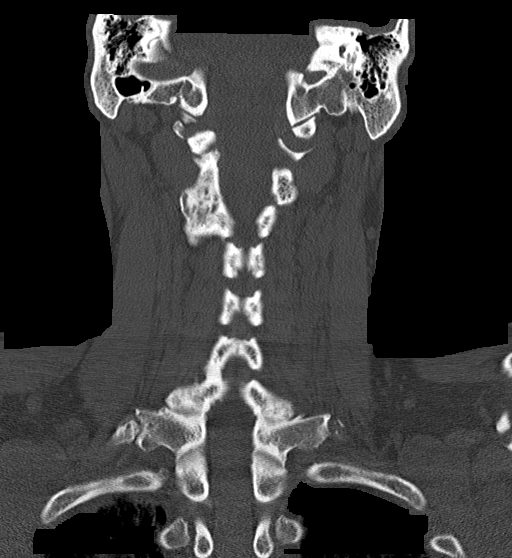

[Series 7: orthogonal axials · axial · 0.21mm/px · z∈[+1282,+1402]mm · 5 of 103 slices shown, 7 images]
[im 18/103  soft-tissue]
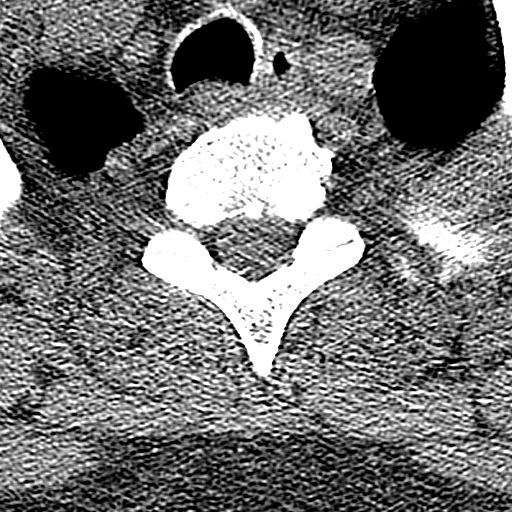
[im 18/103  bone]
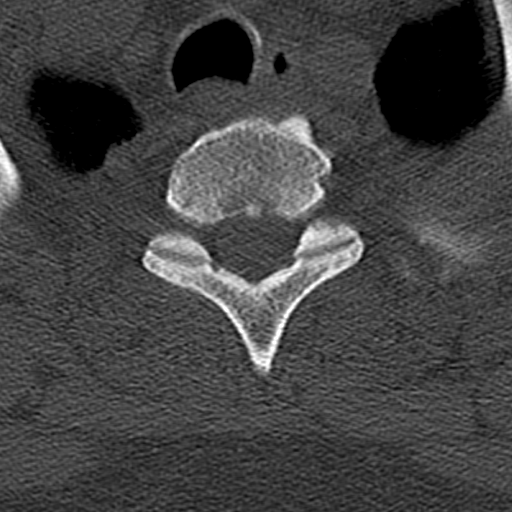
[im 35/103  bone]
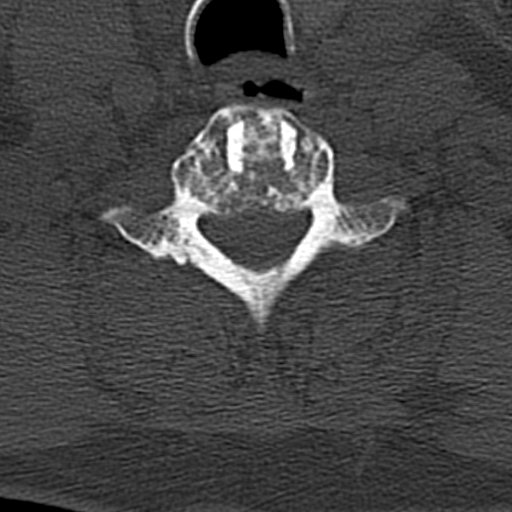
[im 52/103  bone]
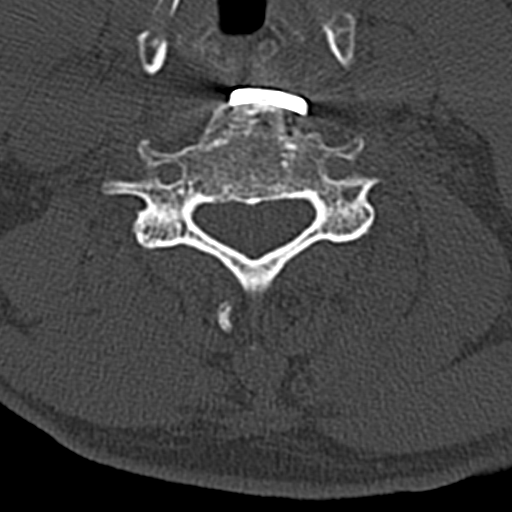
[im 69/103  bone]
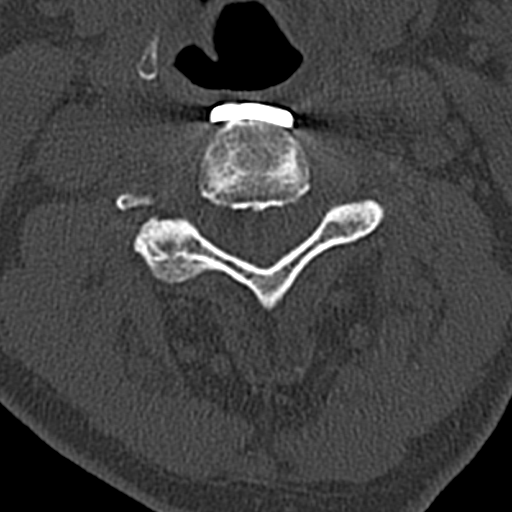
[im 86/103  soft-tissue]
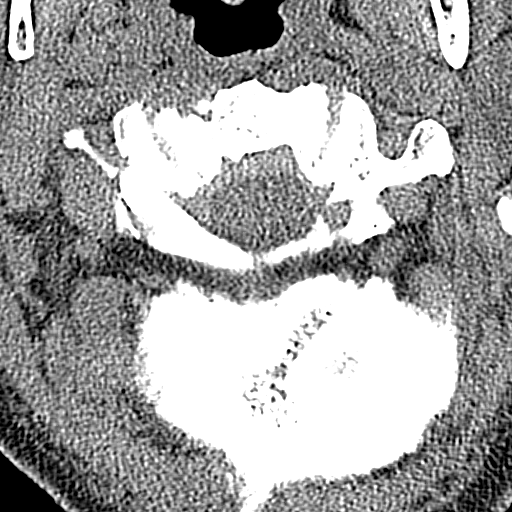
[im 86/103  bone]
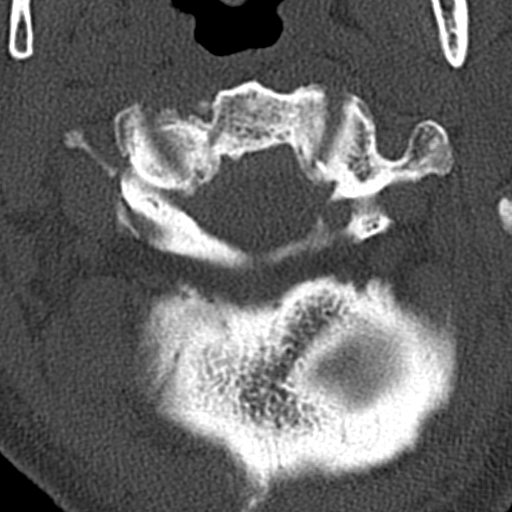

[13 of 33 positions shown; findings below may reference images not displayed]

FINDINGS: Alignment: Normal.

Skull base and vertebrae: No acute fracture. No primary bone lesion
or focal pathologic process.

Soft tissues and spinal canal: No prevertebral fluid or swelling. No
visible canal hematoma.

Disc levels: Mild to moderate severity endplate sclerosis and
moderate severity intervertebral disc space narrowing is seen at the
level of C2-C3. A metallic density fusion plate and screws are seen
along the anterior aspects of the C3, C4 and C5 vertebral bodies.
Evidence of prior cervical fusion is seen at the levels of C5-C6 and
C6-C7.

Upper chest: Mild scarring and/or atelectasis is seen within the
bilateral apices.

Other: None.
IMPRESSION: 1. Mild to moderate severity degenerative changes at the level of
C2-C3.
2. Evidence of cervical fusion at the levels of C3-C4, C4-C5, C5-C6
and C6-C7.

## 2022-10-24 ENCOUNTER — Encounter (HOSPITAL_BASED_OUTPATIENT_CLINIC_OR_DEPARTMENT_OTHER): Payer: Self-pay

## 2022-10-24 DIAGNOSIS — G4733 Obstructive sleep apnea (adult) (pediatric): Secondary | ICD-10-CM

## 2022-12-09 ENCOUNTER — Ambulatory Visit (HOSPITAL_BASED_OUTPATIENT_CLINIC_OR_DEPARTMENT_OTHER): Payer: Medicare PPO | Attending: Student | Admitting: Internal Medicine

## 2022-12-09 VITALS — Ht 70.0 in | Wt 185.0 lb

## 2022-12-09 DIAGNOSIS — G4733 Obstructive sleep apnea (adult) (pediatric): Secondary | ICD-10-CM | POA: Insufficient documentation

## 2022-12-09 DIAGNOSIS — I10 Essential (primary) hypertension: Secondary | ICD-10-CM | POA: Insufficient documentation

## 2022-12-09 DIAGNOSIS — G4761 Periodic limb movement disorder: Secondary | ICD-10-CM | POA: Diagnosis not present

## 2022-12-09 DIAGNOSIS — I493 Ventricular premature depolarization: Secondary | ICD-10-CM | POA: Insufficient documentation

## 2022-12-09 DIAGNOSIS — R5383 Other fatigue: Secondary | ICD-10-CM | POA: Diagnosis present

## 2022-12-17 DIAGNOSIS — G4733 Obstructive sleep apnea (adult) (pediatric): Secondary | ICD-10-CM

## 2022-12-17 NOTE — Procedures (Signed)
    Patient Name: Sergio Miller, Sergio Miller Date: 12/09/2022 Gender: Male D.O.B: [birth year] Age (years): 72 Referring Provider: Gerrie Nordmann Height (inches): 70 Interpreting Physician: Jetty Duhamel MD, ABSM Weight (lbs): 185 RPSGT: Lowry Ram BMI: 27 MRN: [id number] Neck Size: 15.50  CLINICAL INFORMATION Sleep Study Type: NPSG Indication for sleep study: Fatigue, Hypertension Epworth Sleepiness Score: 8  SLEEP STUDY TECHNIQUE As per the AASM Manual for the Scoring of Sleep and Associated Events v2.3 (April 2016) with a hypopnea requiring 4% desaturations.  The channels recorded and monitored were frontal, central and occipital EEG, electrooculogram (EOG), submentalis EMG (chin), nasal and oral airflow, thoracic and abdominal wall motion, anterior tibialis EMG, snore microphone, electrocardiogram, and pulse oximetry.  MEDICATIONS Medications self-administered by patient taken the night of the study : none reported  SLEEP ARCHITECTURE The study was initiated at 10:08:43 PM and ended at 4:48:23 AM.  Sleep onset time was 4.5 minutes and the sleep efficiency was 83.6%. The total sleep time was 334.2 minutes.  Stage REM latency was 3.0 minutes.  The patient spent 6.1% of the night in stage N1 sleep, 62.7% in stage N2 sleep, 0.0% in stage N3 and 31.1% in REM.  Alpha intrusion was absent.  Supine sleep was 52.13%.  RESPIRATORY PARAMETERS The overall apnea/hypopnea index (AHI) was 11.0 per hour. There were 13 total apneas, including 8 obstructive, 5 central and 0 mixed apneas. There were 48 hypopneas and 9 RERAs.  The AHI during Stage REM sleep was 7.5 per hour.  AHI while supine was 17.2 per hour.  The mean oxygen saturation was 94.2%. The minimum SpO2 during sleep was 82.0%.  snoring was noted during this study.  CARDIAC DATA The 2 lead EKG demonstrated sinus rhythm. The mean heart rate was 54.2 beats per minute. Other EKG findings include: PVCs.  LEG MOVEMENT  DATA The total PLMS were 0 with a resulting PLMS index of 0.0. Associated arousal with leg movement index was 9.0 .  IMPRESSIONS - Mild obstructive sleep apnea occurred during this study (AHI = 11.0/h). - Insufficient early events to meet protocol requirement for split CPAP titration. - No significant central sleep apnea occurred during this study (CAI = 0.9/h). - Mild oxygen desaturation was noted during this study (Min O2 = 82.0%, Mean 94.2%). - No snoring was audible during this study. - EKG findings include PVCs. - Total Limb Movements 320 (57.4/hr). Limb Movement with arousal or awakening 50 (9/hr).  DIAGNOSIS - Obstructive Sleep Apnea (G47.33) - Periodic Limb Movement with Arousal  RECOMMENDATIONS - Suggest CPAP titration sleep study or autopap. Other options for managing OSA would be based on clinical judgment. - Consider trial of Mirapex, Requip, or Sinemet for treatment of Periodic Leg Movements of Sleep. - Be careful with alcohol, sedatives and other CNS depressants that may worsen sleep apnea and disrupt normal sleep architecture. - Sleep hygiene should be reviewed to assess factors that may improve sleep quality. - Weight management and regular exercise should be initiated or continued if appropriate.  [Electronically signed] 12/17/2022 12:12 PM  Jetty Duhamel MD, ABSM Diplomate, American Board of Sleep Medicine NPI: [id number]                         Jetty Duhamel Diplomate, American Board of Sleep Medicine  ELECTRONICALLY SIGNED ON:  12/17/2022, 12:06 PM [facility] PH: [phone]   FX: [phone] ACCREDITED BY THE AMERICAN ACADEMY OF SLEEP MEDICINE
